# Patient Record
Sex: Male | Born: 1975
Health system: Southern US, Community
[De-identification: ages and names within clinical notes are randomized; demographics above are authoritative.]

## PROBLEM LIST (undated history)

## (undated) DIAGNOSIS — M545 Low back pain, unspecified: Secondary | ICD-10-CM

## (undated) DIAGNOSIS — Z8619 Personal history of other infectious and parasitic diseases: Secondary | ICD-10-CM

## (undated) DIAGNOSIS — F419 Anxiety disorder, unspecified: Secondary | ICD-10-CM

## (undated) DIAGNOSIS — J45909 Unspecified asthma, uncomplicated: Secondary | ICD-10-CM

## (undated) HISTORY — DX: Unspecified asthma, uncomplicated: J45.909

## (undated) HISTORY — DX: Anxiety disorder, unspecified: F41.9

## (undated) HISTORY — PX: TONSILLECTOMY: SUR1361

## (undated) HISTORY — DX: Low back pain: M54.5

## (undated) HISTORY — DX: Low back pain, unspecified: M54.50

## (undated) HISTORY — DX: Personal history of other infectious and parasitic diseases: Z86.19

---

## 2015-08-10 ENCOUNTER — Telehealth: Payer: Self-pay | Admitting: Behavioral Health

## 2015-08-10 NOTE — Telephone Encounter (Signed)
Unable to reach patient at time of Pre-Visit Call.  Left message for patient to return call when available.    

## 2015-08-11 ENCOUNTER — Ambulatory Visit (HOSPITAL_BASED_OUTPATIENT_CLINIC_OR_DEPARTMENT_OTHER)
Admission: RE | Admit: 2015-08-11 | Discharge: 2015-08-11 | Disposition: A | Discharge status: Home / Self Care | Payer: 59 | Source: Ambulatory Visit | Attending: Physician Assistant | Admitting: Physician Assistant

## 2015-08-11 ENCOUNTER — Encounter: Payer: Self-pay | Admitting: Physician Assistant

## 2015-08-11 ENCOUNTER — Ambulatory Visit (INDEPENDENT_AMBULATORY_CARE_PROVIDER_SITE_OTHER): Payer: 59 | Admitting: Physician Assistant

## 2015-08-11 VITALS — BP 122/84 | HR 88 | Temp 98.5°F | Resp 16 | Ht 66.75 in | Wt 141.0 lb

## 2015-08-11 DIAGNOSIS — M545 Low back pain, unspecified: Secondary | ICD-10-CM

## 2015-08-11 DIAGNOSIS — M25551 Pain in right hip: Secondary | ICD-10-CM | POA: Insufficient documentation

## 2015-08-11 DIAGNOSIS — G8929 Other chronic pain: Secondary | ICD-10-CM

## 2015-08-11 MED ORDER — CELECOXIB 200 MG PO CAPS: 200.0000 mg | ORAL_CAPSULE | Freq: Two times a day (BID) | ORAL | 0 refills | Status: DC

## 2015-08-11 NOTE — Patient Instructions (Signed)
Please go to the lab for blood work. Then go downstairs for imaging. I will call with your results.  Start the Celebrex twice daily with food. Limit heavy lifting or overexertion.  Follow-up with me in 2 weeks. Return sooner if needed.

## 2015-08-11 NOTE — Progress Notes (Signed)
Patient presents to clinic today to establish care.  Patient endorses 10 year span of intermittent right-sided low back pain, worsening over the past 6 months. Denies recent trauma or injury. 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 Pain is present daily. Endorses sharp pain with movement, but baseline aching at the end of the day. Notes radiation into R hip and groin/testicle. Denies testicular swelling or abnormality.  Denies history of hernia. Denies change to bowel or bladder habits. Endorses prior negative workup 5 years ago including prior MRI of lumbar spine that he was told was negative.   Health Maintenance: Immunizations -- Is unsure of last tetanus.   Past Medical History:  Diagnosis Date  . History of chicken pox   . LBP (low back pain)     Past Surgical History:  Procedure Laterality Date  . TONSILLECTOMY      No current outpatient prescriptions on file prior to visit.   No current facility-administered medications on file prior to visit.     No Known Allergies  Family History  Problem Relation Age of Onset  . Kidney Stones Mother   . Hepatitis C Father   . Diabetes Father   . Hypertension Father   . Diabetes Maternal Grandmother   . Breast cancer Paternal Grandmother     Metastatic  . Heart disease Paternal Grandfather   . Heart attack Paternal Grandfather     Social History   Social History  . Marital status: Single    Spouse name: N/A  . Number of children: 0  . Years of education: N/A   Occupational History  . 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 Smoking status: Current Every Day Smoker  . Smokeless tobacco: Never Used  . Alcohol use Not on file  . Drug use: Unknown  . Sexual activity: Not on file   Other Topics Concern  . Not on file   Social History Narrative  . No narrative on file   Review of Systems  Constitutional: Positive for malaise/fatigue. Negative for fever and weight  loss.  Cardiovascular: Negative for chest pain.  Gastrointestinal: Negative for abdominal pain, blood in stool, constipation, diarrhea and melena.  Genitourinary: Negative for dysuria, flank pain, frequency, hematuria and urgency.  Musculoskeletal: Positive for back pain and joint pain. Negative for falls, myalgias and neck pain.  Neurological: Negative for dizziness, tingling, tremors and focal weakness.   BP 122/84 (BP Location: Right Arm, Patient Position: Sitting, Cuff Size: Normal)   Pulse 88   Temp 98.5 F (36.9 C) (Oral)   Resp 16   Ht 5' 6.75" (1.695 m)   Wt 141 lb (64 kg)   SpO2 99%   BMI 22.25 kg/m   Physical Exam  Constitutional: He is oriented to person, place, and time and well-developed, well-nourished, and in no distress.  HENT:  Head: Normocephalic and atraumatic.  Eyes: Conjunctivae are normal.  Cardiovascular: Normal rate, regular rhythm, normal heart sounds and intact distal pulses.   Pulmonary/Chest: Effort normal and breath sounds normal. No respiratory distress. He has no wheezes. He has no rales. He exhibits no tenderness.  Musculoskeletal:       Right hip: He exhibits decreased range of motion and tenderness. He exhibits normal strength, no bony tenderness and no swelling.       Lumbar back: He exhibits pain. He exhibits no tenderness, no bony tenderness and no spasm.  Neurological: He is alert  and oriented to person, place, and time.  Skin: Skin is warm and dry. No rash noted.  Psychiatric: Affect normal.  Vitals reviewed.  Assessment/Plan: 1. Chronic lumbosacral pain Arthritis? AS? Will check imaging today and ESR. Will begin Celebrex 200 mg BID. FU 2 weeks. - celecoxib (CELEBREX) 200 MG capsule; Take 1 capsule (200 mg total) by mouth 2 (two) times daily.  Dispense: 60 capsule; Refill: 0 - DG Lumbar Spine Complete; Future - DG HIP UNILAT W OR W/O PELVIS 2-3 VIEWS RIGHT; Future - Sed Rate (ESR)  2. Chronic right hip pain Will obtain x-ray today to  further assess. Rx Celebrex. There is tightness in the abductors and adductors with some mild stiffness on ROM. No pain with ROM. Will obtain ESR today as well. Will attempt to get old records. FU 2 weeks.  - celecoxib (CELEBREX) 200 MG capsule; Take 1 capsule (200 mg total) by mouth 2 (two) times daily.  Dispense: 60 capsule; Refill: 0 - DG Lumbar Spine Complete; Future - DG HIP UNILAT W OR W/O PELVIS 2-3 VIEWS RIGHT; Future - Sed Rate (ESR)   Leeanne Rio, PA-C

## 2015-08-12 LAB — SEDIMENTATION RATE: Sed Rate: 1 mm/hr (ref 0–15)

## 2015-08-17 ENCOUNTER — Telehealth: Payer: Self-pay | Admitting: Physician Assistant

## 2015-08-17 ENCOUNTER — Other Ambulatory Visit: Payer: Self-pay | Admitting: Physician Assistant

## 2015-08-17 DIAGNOSIS — G8929 Other chronic pain: Secondary | ICD-10-CM

## 2015-08-17 DIAGNOSIS — M545 Low back pain, unspecified: Secondary | ICD-10-CM

## 2015-08-17 MED ORDER — CYCLOBENZAPRINE HCL 10 MG PO TABS: 10.0000 mg | ORAL_TABLET | Freq: Every day | ORAL | 0 refills | Status: DC

## 2015-08-17 MED ORDER — TRAMADOL HCL 50 MG PO TABS: 50.0000 mg | ORAL_TABLET | Freq: Two times a day (BID) | ORAL | 1 refills | Status: DC | PRN

## 2015-08-17 NOTE — Telephone Encounter (Signed)
Spoke with patient. Celebrex causing flushing and racing heart. Has stopped medication with no recurrence of symptoms. Will Rx Tramadol to use for moderate to severe pain. Will attempt trial of Flexeril QPM. Referral to Orthopedics placed.

## 2015-08-17 NOTE — Telephone Encounter (Signed)
°  Relation to ZO:XWRUpt:self Call back [phone] Pharmacy:  Reason for call: pt would like for you to call him back, states you had left a message for him on yesterday in regards to him continuing to take rx celebrex pt states he is having a lot of side effects from it and would like to know if there is alternatives states to call him on the work number above he will be there until 4 pm.

## 2015-08-25 ENCOUNTER — Ambulatory Visit: Payer: 59 | Admitting: Physician Assistant

## 2015-09-21 ENCOUNTER — Other Ambulatory Visit: Payer: Self-pay | Admitting: Orthopaedic Surgery

## 2015-09-21 DIAGNOSIS — M545 Low back pain: Secondary | ICD-10-CM

## 2015-10-01 ENCOUNTER — Ambulatory Visit
Admission: RE | Admit: 2015-10-01 | Discharge: 2015-10-01 | Disposition: A | Discharge status: Home / Self Care | Payer: 59 | Source: Ambulatory Visit | Attending: Orthopaedic Surgery | Admitting: Orthopaedic Surgery

## 2015-10-01 DIAGNOSIS — M545 Low back pain: Secondary | ICD-10-CM

## 2015-10-11 ENCOUNTER — Ambulatory Visit (INDEPENDENT_AMBULATORY_CARE_PROVIDER_SITE_OTHER): Payer: 59 | Admitting: Orthopaedic Surgery

## 2015-10-11 DIAGNOSIS — M5440 Lumbago with sciatica, unspecified side: Secondary | ICD-10-CM

## 2016-03-21 ENCOUNTER — Ambulatory Visit (INDEPENDENT_AMBULATORY_CARE_PROVIDER_SITE_OTHER): Payer: 59 | Admitting: Physician Assistant

## 2016-03-21 ENCOUNTER — Encounter: Payer: Self-pay | Admitting: Physician Assistant

## 2016-03-21 DIAGNOSIS — M545 Low back pain, unspecified: Secondary | ICD-10-CM

## 2016-03-21 DIAGNOSIS — M546 Pain in thoracic spine: Secondary | ICD-10-CM | POA: Diagnosis not present

## 2016-03-21 DIAGNOSIS — M542 Cervicalgia: Secondary | ICD-10-CM

## 2016-03-21 MED ORDER — METHOCARBAMOL 500 MG PO TABS: 500.0000 mg | ORAL_TABLET | Freq: Three times a day (TID) | ORAL | 0 refills | Status: DC

## 2016-03-21 MED ORDER — TRAMADOL HCL 50 MG PO TABS: 50.0000 mg | ORAL_TABLET | Freq: Two times a day (BID) | ORAL | 0 refills | Status: DC | PRN

## 2016-03-21 NOTE — Patient Instructions (Signed)
Please limit heavy lifting and overexertion. Take Tramadol and Robaxin as directed if needed for pain and spasm. The Robaxin can be taken each evening, and up to 3 times per day. However you cannot drive or operate heavy machinery while taking this medication.   Please use a heating pad to the affected areas a few times per day. If symptoms are not improving over the next week, please call or come see us.
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
## 2016-03-21 NOTE — Progress Notes (Signed)
Pre visit review using our clinic review tool, if applicable. No additional management support is needed unless otherwise documented below in the visit note. 

## 2016-08-08 ENCOUNTER — Encounter: Payer: Self-pay | Admitting: Physician Assistant

## 2016-08-08 ENCOUNTER — Ambulatory Visit (INDEPENDENT_AMBULATORY_CARE_PROVIDER_SITE_OTHER): Payer: 59 | Admitting: Physician Assistant

## 2016-08-08 VITALS — BP 132/88 | HR 99 | Temp 98.2°F | Resp 14 | Ht 67.0 in | Wt 139.0 lb

## 2016-08-08 DIAGNOSIS — F419 Anxiety disorder, unspecified: Secondary | ICD-10-CM | POA: Diagnosis not present

## 2016-08-08 DIAGNOSIS — F5105 Insomnia due to other mental disorder: Secondary | ICD-10-CM

## 2016-08-08 MED ORDER — VENLAFAXINE HCL ER 37.5 MG PO CP24
ORAL_CAPSULE | ORAL | 0 refills | Status: DC
Start: 2016-08-08 — End: 2016-08-14

## 2016-08-08 MED ORDER — CLONAZEPAM 0.5 MG PO TABS: 0.5000 mg | ORAL_TABLET | Freq: Two times a day (BID) | ORAL | 1 refills | Status: DC | PRN

## 2016-08-08 NOTE — Assessment & Plan Note (Signed)
Long-standing with recent deterioration. Discussed counseling. Handout given. Patient to start using Headspace app. Will start trial of Effexor XR giving history -- 37.5 mg daily x 1 week before increasing to 75 mg daily. Close follow-up scheduled. Alarm signs/symptoms reviewed that would prompt need for ER assessment.

## 2016-08-08 NOTE — Patient Instructions (Signed)
Please go to the lab for blood work. I will call with your results. Start the Effexor as directed.  The Klonopin is used for panic attack only. Not to be taken more than as directed.  Please look into counseling options through your work. The handout I have given you has our counseling services printed on it. I have written down some names for you.  Try to download the Headspace app on your phone to help with anxiety.   Follow-up with me in 3-4 weeks. If symptoms are worsening at all with this regimen, please return to see me immediately.

## 2016-08-08 NOTE — Progress Notes (Signed)
   Patient presents to clinic today to discuss worsening anxiety. Patient endorses prior history of anxiety/panic disorder since early adulthood. Has been dong well for several years off of medication. Notes worsening of symptoms over the past few months, especially with stressful changes at work. Notes several panic attacks per week. Notes some change in sleep. Denies change in appetite. Denies depressed mood or anhedonia. Denies SI/HI. Endorses taking multiple SSRIs, BuSpar and Trazodone previously with no improvement in symptoms. States that Klonopin and similar medications interact badly with him.   Past Medical History:  Diagnosis Date  . History of chicken pox   . LBP (low back pain)     Current Outpatient Prescriptions on File Prior to Visit  Medication Sig Dispense Refill  . ibuprofen (ADVIL,MOTRIN) 200 MG tablet Take 200 mg by mouth every 6 (six) hours as needed.    . Multiple Vitamins-Minerals (MENS MULTIVITAMIN PLUS) TABS Take by mouth daily.     No current facility-administered medications on file prior to visit.     Allergies  Allergen Reactions  . Celebrex [Celecoxib] Palpitations    Family History  Problem Relation Age of Onset  . Kidney Stones Mother   . Hepatitis C Father   . Diabetes Father   . Hypertension Father   . Diabetes Maternal Grandmother   . Breast cancer Paternal Grandmother        Metastatic  . Heart disease Paternal Grandfather   . Heart attack Paternal Grandfather     Social History   Social History  . Marital status: Single    Spouse name: N/A  . Number of children: 0  . Years of education: N/A   Occupational History  . 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 Smoking status: Current Every Day Smoker    Packs/day: 0.25    Years: 25.00    Types: Cigarettes  . Smokeless tobacco: Never Used  . Alcohol use Yes     Comment: social -- maybe 1-2 per week  . Drug use: No  . Sexual activity: Not Currently   Other  Topics Concern  . None   Social History Narrative  . None   Review of Systems - See HPI.  All other ROS are negative.  BP 132/88   Pulse 99   Temp 98.2 F (36.8 C) (Oral)   Resp 14   Ht 5\' 7"  (1.702 m)   Wt 139 lb (63 kg)   SpO2 99%   BMI 21.77 kg/m   Physical Exam  Constitutional: He is oriented to person, place, and time and well-developed, well-nourished, and in no distress.  HENT:  Head: Normocephalic and atraumatic.  Eyes: Conjunctivae are normal.  Neck: Neck supple. No thyromegaly present.  Cardiovascular: Normal rate, regular rhythm, normal heart sounds and intact distal pulses.   Neurological: He is alert and oriented to person, place, and time.  Skin: Skin is warm and dry. No rash noted.  Psychiatric:  Anxious affect.   Vitals reviewed.  Assessment/Plan: Anxiety Long-standing with recent deterioration. Discussed counseling. Handout given. Patient to start using Headspace app. Will start trial of Effexor XR giving history -- 37.5 mg daily x 1 week before increasing to 75 mg daily. Close follow-up scheduled. Alarm signs/symptoms reviewed that would prompt need for ER assessment.     Piedad ClimesMartin, Ivaan Liddy Cody, PA-C

## 2016-08-08 NOTE — Progress Notes (Signed)
Pre visit review using our clinic review tool, if applicable. No additional management support is needed unless otherwise documented below in the visit note. 

## 2016-08-09 ENCOUNTER — Telehealth: Payer: Self-pay | Admitting: Physician Assistant

## 2016-08-09 LAB — TSH: TSH: 1.83 mIU/L (ref 0.40–4.50)

## 2016-08-09 NOTE — Telephone Encounter (Signed)
Patient states that he cannot take Effexor, he is unable to sleep since taking it, he's jittery, his heart is racing, and pupils dilated. Please advise.

## 2016-08-10 NOTE — Telephone Encounter (Signed)
LMOVM advising patient to call back about current symptoms since starting the Effexor.

## 2016-08-10 NOTE — Telephone Encounter (Signed)
Pt to hold anxiety meds.  Since he has been on multiple meds (according to PCP notes) will hold off on starting new med at this time.  He should schedule to see Selena BattenCody early next week

## 2016-08-10 NOTE — Telephone Encounter (Signed)
Advised patient per Dr Beverely Lowabori would like to wait on restarting any medications at this time. Continue to stay off the Effexor. He is agreeable. He is scheduled with PCP on 08/14/16 at 8am.

## 2016-08-10 NOTE — Telephone Encounter (Signed)
Spoke with patient and he states after taking 1 dose of the Effexor he started having the jittery, heart racing symptoms. He has not taken a dose today and symptoms have resolved. Cody did not give rx for Klonopin due to patient stating he has already taken it and it made his anxiety worst.  Please advise in PCP absence

## 2016-08-14 ENCOUNTER — Encounter: Payer: Self-pay | Admitting: Physician Assistant

## 2016-08-14 ENCOUNTER — Ambulatory Visit (INDEPENDENT_AMBULATORY_CARE_PROVIDER_SITE_OTHER): Payer: 59 | Admitting: Physician Assistant

## 2016-08-14 VITALS — BP 128/78 | HR 98 | Temp 98.3°F | Resp 14 | Ht 67.0 in | Wt 141.0 lb

## 2016-08-14 DIAGNOSIS — F419 Anxiety disorder, unspecified: Secondary | ICD-10-CM | POA: Diagnosis not present

## 2016-08-14 MED ORDER — ALPRAZOLAM 0.25 MG PO TABS: 0.2500 mg | ORAL_TABLET | Freq: Two times a day (BID) | ORAL | 0 refills | Status: DC | PRN

## 2016-08-14 MED ORDER — MIRTAZAPINE 15 MG PO TABS: 15.0000 mg | ORAL_TABLET | Freq: Every day | ORAL | 1 refills | Status: DC

## 2016-08-14 NOTE — Patient Instructions (Signed)
Please use the Alprazolam as directed when needed for panic attack. Use sparingly and no more than as directed. Start the Remeron in the evening. This should help with rest and with anxiety.  This is a different class of medication from the Effexor so I am hopeful you will tolerate much better.   Follow-up with me in 2 weeks for a complete physical.

## 2016-08-14 NOTE — Progress Notes (Signed)
Pre visit review using our clinic review tool, if applicable. No additional management support is needed unless otherwise documented below in the visit note. 

## 2016-08-14 NOTE — Progress Notes (Signed)
Patient presents to clinic today to discuss management for anxiety. At visit last week, patient was started on Effexor for anxiety/panic disorder as he is intolerant to most SSRIs, BuSpar, TCA due to side effect. Patient states he took one dose of the medication which made him fell very anxious, jittery and like he was having an out of body experience with medication. Stopped with cessation of medication.   Past Medical History:  Diagnosis Date  . History of chicken pox   . LBP (low back pain)     Current Outpatient Prescriptions on File Prior to Visit  Medication Sig Dispense Refill  . ibuprofen (ADVIL,MOTRIN) 200 MG tablet Take 200 mg by mouth every 6 (six) hours as needed.    . Multiple Vitamins-Minerals (MENS MULTIVITAMIN PLUS) TABS Take by mouth daily.     No current facility-administered medications on file prior to visit.     Allergies  Allergen Reactions  . Celebrex [Celecoxib] Palpitations    Family History  Problem Relation Age of Onset  . Kidney Stones Mother   . Hepatitis C Father   . Diabetes Father   . Hypertension Father   . Diabetes Maternal Grandmother   . Breast cancer Paternal Grandmother        Metastatic  . Heart disease Paternal Grandfather   . Heart attack Paternal Grandfather     Social History   Social History  . Marital status: Single    Spouse name: N/A  . Number of children: 0  . Years of education: N/A   Occupational History  . 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 Smoking status: Current Every Day Smoker    Packs/day: 0.25    Years: 25.00    Types: Cigarettes  . Smokeless tobacco: Never Used  . Alcohol use Yes     Comment: social -- maybe 1-2 per week  . Drug use: No  . Sexual activity: Not Currently   Other Topics Concern  . None   Social History Narrative  . None   Review of Systems - See HPI.  All other ROS are negative.  BP 128/78   Pulse 98   Temp 98.3 F (36.8 C) (Oral)   Resp 14    Ht 5\' 7"  (1.702 m)   Wt 141 lb (64 kg)   SpO2 99%   BMI 22.08 kg/m   Physical Exam  Constitutional: He is oriented to person, place, and time and well-developed, well-nourished, and in no distress.  HENT:  Head: Normocephalic and atraumatic.  Eyes: Conjunctivae are normal.  Neck: Neck supple.  Cardiovascular: Normal rate, regular rhythm, normal heart sounds and intact distal pulses.   Pulmonary/Chest: Effort normal and breath sounds normal. No respiratory distress. He has no wheezes. He has no rales. He exhibits no tenderness.  Neurological: He is alert and oriented to person, place, and time.  Skin: Skin is warm and dry. No rash noted.  Psychiatric: Affect normal.  Vitals reviewed.   Recent Results (from the past 2160 hour(s))  TSH     Status: None   Collection Time: 08/08/16  4:46 PM  Result Value Ref Range   TSH 1.83 0.40 - 4.50 mIU/L    Assessment/Plan: Anxiety Unable to tolerate multiple medications. Notes tolerating Alprazolam previously. Will give short-course of these for panic attack alone. To be used sparingly and no more than as directed. Will start trial or Remeron 15 mg nightly to help with anxiety.  F/U scheduled.     Piedad ClimesMartin, Quron Ruddy Cody, PA-C

## 2016-08-14 NOTE — Assessment & Plan Note (Signed)
Unable to tolerate multiple medications. Notes tolerating Alprazolam previously. Will give short-course of these for panic attack alone. To be used sparingly and no more than as directed. Will start trial or Remeron 15 mg nightly to help with anxiety. F/U scheduled.

## 2016-08-24 ENCOUNTER — Ambulatory Visit: Payer: 59 | Admitting: Psychology

## 2016-09-05 ENCOUNTER — Ambulatory Visit: Payer: Self-pay | Admitting: Physician Assistant

## 2016-09-06 ENCOUNTER — Ambulatory Visit (INDEPENDENT_AMBULATORY_CARE_PROVIDER_SITE_OTHER): Payer: 59 | Admitting: Physician Assistant

## 2016-09-06 ENCOUNTER — Encounter: Payer: Self-pay | Admitting: Physician Assistant

## 2016-09-06 VITALS — BP 112/80 | HR 84 | Temp 98.3°F | Resp 14 | Ht 67.0 in | Wt 142.0 lb

## 2016-09-06 DIAGNOSIS — F419 Anxiety disorder, unspecified: Secondary | ICD-10-CM

## 2016-09-06 DIAGNOSIS — Z0000 Encounter for general adult medical examination without abnormal findings: Secondary | ICD-10-CM | POA: Diagnosis not present

## 2016-09-06 LAB — COMPREHENSIVE METABOLIC PANEL
ALK PHOS: 78 U/L (ref 39–117)
ALT: 25 U/L (ref 0–53)
AST: 19 U/L (ref 0–37)
Albumin: 4.7 g/dL (ref 3.5–5.2)
BUN: 16 mg/dL (ref 6–23)
CALCIUM: 9.5 mg/dL (ref 8.4–10.5)
CO2: 24 mEq/L (ref 19–32)
Chloride: 105 mEq/L (ref 96–112)
Creatinine, Ser: 0.99 mg/dL (ref 0.40–1.50)
GFR: 88.29 mL/min (ref >60.00)
Glucose, Bld: 107 mg/dL — ABNORMAL HIGH (ref 70–99)
Potassium: 4.3 mEq/L (ref 3.5–5.1)
Sodium: 139 mEq/L (ref 135–145)
TOTAL PROTEIN: 6.9 g/dL (ref 6.0–8.3)
Total Bilirubin: 0.7 mg/dL (ref 0.2–1.2)

## 2016-09-06 LAB — LIPID PANEL
Cholesterol: 180 mg/dL (ref 0–200)
HDL: 55.6 mg/dL (ref >39.00)
LDL Cholesterol: 109 mg/dL — ABNORMAL HIGH (ref 0–99)
NONHDL: 124.62
TRIGLYCERIDES: 77 mg/dL (ref 0.0–149.0)
Total CHOL/HDL Ratio: 3
VLDL: 15.4 mg/dL (ref 0.0–40.0)

## 2016-09-06 LAB — HEMOGLOBIN A1C: Hgb A1c MFr Bld: 5 % (ref 4.6–6.5)

## 2016-09-06 LAB — CBC
HCT: 48.3 % (ref 39.0–52.0)
HEMOGLOBIN: 16.8 g/dL (ref 13.0–17.0)
MCHC: 34.8 g/dL (ref 30.0–36.0)
MCV: 96.1 fl (ref 78.0–100.0)
PLATELETS: 272 10*3/uL (ref 150.0–400.0)
RBC: 5.02 Mil/uL (ref 4.22–5.81)
RDW: 12.7 % (ref 11.5–15.5)
WBC: 9.2 10*3/uL (ref 4.0–10.5)

## 2016-09-06 MED ORDER — SUVOREXANT 10 MG PO TABS: 10.0000 mg | ORAL_TABLET | Freq: Every day | ORAL | 0 refills | Status: DC

## 2016-09-06 MED ORDER — ALPRAZOLAM 0.25 MG PO TABS: 0.2500 mg | ORAL_TABLET | Freq: Two times a day (BID) | ORAL | 2 refills | Status: DC | PRN

## 2016-09-06 NOTE — Progress Notes (Signed)
Pre visit review using our clinic review tool, if applicable. No additional management support is needed unless otherwise documented below in the visit note. 

## 2016-09-06 NOTE — Assessment & Plan Note (Signed)
Continue alprazolam PRN for anxiety. Will stop Remeron and start trial of Belsomra nightly for sleep. Sleep hygiene practices reviewed. Increase evening aerobic exercise to promote restful sleep and to help with stress levels. Close follow-up scheduled.

## 2016-09-06 NOTE — Progress Notes (Signed)
Patient presents to clinic today for annual exam.  Patient is fasting for labs. Patient is staying active with work but is otherwise not engaging any physical activity for exercise. Endorses a well-balanced diet overall.   Acute Concerns: Patient denies acute concerns today.  Chronic Issues: Anxiety State -- At last visit patient was started on alprazolam for acute anxiety. Has mainly taken once daily -- rare cases has used twice daily. Is noting improvement in mood. Work stressors are improved which is helping panic attack. Again patient has been on numerous medications for anxiety with side effects/sub therapeutic effects. Does not wish to add on other medications at present.   Insomnia -- Most recently placed on Remeron 15 mg nightly for sleep due to intolerance to other medications. Endorses he took medication as directed for a week but it made him excessively groggy the next day despite getting 8-9 hours of sleep. Was making it hard for him to function in the mornings so he stopped. Would like to discuss potential trial of Ambien or a similar medication.   Health Maintenance: Immunizations -- Declines flu and tetanus today.  Past Medical History:  Diagnosis Date  . History of chicken pox   . LBP (low back pain)     Past Surgical History:  Procedure Laterality Date  . TONSILLECTOMY      Current Outpatient Prescriptions on File Prior to Visit  Medication Sig Dispense Refill  . ibuprofen (ADVIL,MOTRIN) 200 MG tablet Take 200 mg by mouth every 6 (six) hours as needed.    . Multiple Vitamins-Minerals (MENS MULTIVITAMIN PLUS) TABS Take by mouth daily.     No current facility-administered medications on file prior to visit.     Allergies  Allergen Reactions  . Celebrex [Celecoxib] Palpitations    Family History  Problem Relation Age of Onset  . Kidney Stones Mother   . Hepatitis C Father   . Diabetes Father   . Hypertension Father   . Diabetes Maternal Grandmother   .  Breast cancer Paternal Grandmother        Metastatic  . Heart disease Paternal Grandfather   . Heart attack Paternal Grandfather     Social History   Social History  . Marital status: Single    Spouse name: N/A  . Number of children: 0  . Years of education: N/A   Occupational History  . 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 Smoking status: Current Every Day Smoker    Packs/day: 0.25    Years: 25.00    Types: Cigarettes  . Smokeless tobacco: Never Used  . Alcohol use Yes     Comment: social -- maybe 1-2 per week  . Drug use: No  . Sexual activity: Not Currently   Other Topics Concern  . Not on file   Social History Narrative  . No narrative on file   Review of Systems  Constitutional: Negative for fever and weight loss.  HENT: Negative for ear discharge ( ), ear pain, hearing loss and tinnitus.   Eyes: Positive for blurred vision (Gradual blurring over time). Negative for double vision, photophobia and pain.  Respiratory: Negative for cough and shortness of breath.   Cardiovascular: Negative for chest pain and palpitations.  Gastrointestinal: Negative for abdominal pain, blood in stool, constipation, diarrhea, heartburn, melena, nausea and vomiting.  Genitourinary: Negative for dysuria, flank pain, frequency, hematuria and urgency.  Musculoskeletal: Negative for falls.  Neurological: Negative for  dizziness, loss of consciousness and headaches.  Endo/Heme/Allergies: Negative for environmental allergies.  Psychiatric/Behavioral: Negative for depression, hallucinations, substance abuse and suicidal ideas. The patient is not nervous/anxious and does not have insomnia.    BP 112/80   Pulse 84   Temp 98.3 F (36.8 C) (Oral)   Resp 14   Ht 5\' 7"  (1.702 m)   Wt 142 lb (64.4 kg)   SpO2 99%   BMI 22.24 kg/m   Physical Exam  Constitutional: He is oriented to person, place, and time and well-developed, well-nourished, and in no distress.  HENT:   Head: Normocephalic and atraumatic.  Right Ear: External ear normal.  Left Ear: External ear normal.  Nose: Nose normal.  Mouth/Throat: Oropharynx is clear and moist. No oropharyngeal exudate.  Eyes: Pupils are equal, round, and reactive to light. Conjunctivae and EOM are normal.  Neck: Neck supple. No thyromegaly present.  Cardiovascular: Normal rate, regular rhythm, normal heart sounds and intact distal pulses.   Pulmonary/Chest: Effort normal and breath sounds normal. No respiratory distress. He has no wheezes. He has no rales. He exhibits no tenderness.  Abdominal: Soft. Bowel sounds are normal. He exhibits no distension and no mass. There is no tenderness. There is no rebound and no guarding.  Genitourinary: Testes/scrotum normal and penis normal. No discharge found.  Lymphadenopathy:    He has no cervical adenopathy.  Neurological: He is alert and oriented to person, place, and time.  Skin: Skin is warm and dry. No rash noted.  Psychiatric: Affect normal.  Vitals reviewed.  Recent Results (from the past 2160 hour(s))  TSH     Status: None   Collection Time: 08/08/16  4:46 PM  Result Value Ref Range   TSH 1.83 0.40 - 4.50 mIU/L    Assessment/Plan: Insomnia secondary to anxiety Continue alprazolam PRN for anxiety. Will stop Remeron and start trial of Belsomra nightly for sleep. Sleep hygiene practices reviewed. Increase evening aerobic exercise to promote restful sleep and to help with stress levels. Close follow-up scheduled.   Visit for preventive health examination Depression screen negative. Health Maintenance reviewed -- Declines immunizations today. Preventive schedule discussed and handout given in AVS. Will obtain fasting labs today.     Piedad ClimesMartin, Millan Legan Cody, PA-C

## 2016-09-06 NOTE — Assessment & Plan Note (Signed)
Depression screen negative. Health Maintenance reviewed -- Declines immunizations today. Preventive schedule discussed and handout given in AVS. Will obtain fasting labs today.

## 2016-09-06 NOTE — Patient Instructions (Signed)
Please go to the lab for blood work.   Our office will call you with your results unless you have chosen to receive results via MyChart.  If your blood work is normal we will follow-up each year for physicals and as scheduled for chronic medical problems.  If anything is abnormal we will treat accordingly and get you in for a follow-up.  Please stop the Remeron and start the trial of the Belsomra, taking as directed. Continue Alprazolam as directed. Call me next week to let me know how the Belsomra is working.   Preventive Care 18-39 Years, Male Preventive care refers to lifestyle choices and visits with your health care provider that can promote health and wellness. What does preventive care include?  A yearly physical exam. This is also called an annual well check.  Dental exams once or twice a year.  Routine eye exams. Ask your health care provider how often you should have your eyes checked.  Personal lifestyle choices, including: ? Daily care of your teeth and gums. ? Regular physical activity. ? Eating a healthy diet. ? Avoiding tobacco and drug use. ? Limiting alcohol use. ? Practicing safe sex. What happens during an annual well check? The services and screenings done by your health care provider during your annual well check will depend on your age, overall health, lifestyle risk factors, and family history of disease. Counseling Your health care provider may ask you questions about your:  Alcohol use.  Tobacco use.  Drug use.  Emotional well-being.  Home and relationship well-being.  Sexual activity.  Eating habits.  Work and work Statistician.  Screening You may have the following tests or measurements:  Height, weight, and BMI.  Blood pressure.  Lipid and cholesterol levels. These may be checked every 5 years starting at age 58.  Diabetes screening. This is done by checking your blood sugar (glucose) after you have not eaten for a while  (fasting).  Skin check.  Hepatitis C blood test.  Hepatitis B blood test.  Sexually transmitted disease (STD) testing.  Discuss your test results, treatment options, and if necessary, the need for more tests with your health care provider. Vaccines Your health care provider may recommend certain vaccines, such as:  Influenza vaccine. This is recommended every year.  Tetanus, diphtheria, and acellular pertussis (Tdap, Td) vaccine. You may need a Td booster every 10 years.  Varicella vaccine. You may need this if you have not been vaccinated.  HPV vaccine. If you are 3 or younger, you may need three doses over 6 months.  Measles, mumps, and rubella (MMR) vaccine. You may need at least one dose of MMR.You may also need a second dose.  Pneumococcal 13-valent conjugate (PCV13) vaccine. You may need this if you have certain conditions and have not been vaccinated.  Pneumococcal polysaccharide (PPSV23) vaccine. You may need one or two doses if you smoke cigarettes or if you have certain conditions.  Meningococcal vaccine. One dose is recommended if you are age 39-21 years and a first-year college student living in a residence hall, or if you have one of several medical conditions. You may also need additional booster doses.  Hepatitis A vaccine. You may need this if you have certain conditions or if you travel or work in places where you may be exposed to hepatitis A.  Hepatitis B vaccine. You may need this if you have certain conditions or if you travel or work in places where you may be exposed to hepatitis B.  Haemophilus influenzae type b (Hib) vaccine. You may need this if you have certain risk factors.  Talk to your health care provider about which screenings and vaccines you need and how often you need them. This information is not intended to replace advice given to you by your health care provider. Make sure you discuss any questions you have with your health care  provider. Document Released: 02/14/2001 Document Revised: 09/08/2015 Document Reviewed: 10/20/2014 Elsevier Interactive Patient Education  2017 [employer].

## 2016-10-12 ENCOUNTER — Telehealth: Payer: Self-pay | Admitting: Emergency Medicine

## 2016-10-12 NOTE — Telephone Encounter (Signed)
Received a refill of the Mirtazapaine from [facility]. At last ov we changed to Belsomra. He is due for a follow up appointment to discuss.

## 2016-10-16 ENCOUNTER — Encounter: Payer: Self-pay | Admitting: Physician Assistant

## 2016-10-16 ENCOUNTER — Ambulatory Visit (INDEPENDENT_AMBULATORY_CARE_PROVIDER_SITE_OTHER): Payer: 59 | Admitting: Physician Assistant

## 2016-10-16 VITALS — BP 118/80 | HR 87 | Temp 98.1°F | Resp 14 | Ht 67.0 in | Wt 142.0 lb

## 2016-10-16 DIAGNOSIS — F5105 Insomnia due to other mental disorder: Secondary | ICD-10-CM

## 2016-10-16 DIAGNOSIS — F419 Anxiety disorder, unspecified: Secondary | ICD-10-CM | POA: Diagnosis not present

## 2016-10-16 MED ORDER — MELOXICAM 15 MG PO TABS: 15.0000 mg | ORAL_TABLET | Freq: Every day | ORAL | 0 refills | Status: DC

## 2016-10-16 MED ORDER — ZOLPIDEM TARTRATE ER 6.25 MG PO TBCR: 6.2500 mg | EXTENDED_RELEASE_TABLET | Freq: Every evening | ORAL | 0 refills | Status: DC | PRN

## 2016-10-16 NOTE — Progress Notes (Signed)
Pre visit review using our clinic review tool, if applicable. No additional management support is needed unless otherwise documented below in the visit note. 

## 2016-10-16 NOTE — Assessment & Plan Note (Signed)
Will start trial of Ambien CR -- starting at 6.25 mg and titrating if needed. Also discussed sleep hygiene practices. Handout given. Follow-up scheduled

## 2016-10-16 NOTE — Patient Instructions (Signed)
Please start the Ambien as directed. Try to get in some evening exercise to promote restful sleep. Follow the sleep hygiene measures listed at the bottom of this handout.  Attempt the Mobic once daily on days when back pain is flared up. Take with food. If you note any stomach ache or racing heart stop the medication and call me. This can take the place of the Ibuprofen.  Sleep Hygiene  Do: (1) Go to bed at the same time each day. (2) Get up from bed at the same time each day. (3) Get regular exercise each day, preferably in the morning.  There is goof evidence that regular exercise improves restful sleep.  This includes stretching and aerobic exercise. (4) Get regular exposure to outdoor or bright lights, especially in the late afternoon. (5) Keep the temperature in your bedroom comfortable. (6) Keep the bedroom quiet when sleeping. (7) Keep the bedroom dark enough to facilitate sleep. (8) Use your bed only for sleep and sex. (9) Take medications as directed.  It is helpful to take prescribed sleeping pills 1 hour before bedtime, so they are causing drowsiness when you lie down, or 10 hours before getting up, to avoid daytime drowsiness. (10) Use a relaxation exercise just before going to sleep -- imagery, massage, warm bath. (11) Keep your feet and hands warm.  Wear warm socks and/or mittens or gloves to bed.  Don't: (1) Exercise just before going to bed. (2) Engage in stimulating activity just before bed, such as playing a competitive game, watching an exciting program on television, or having an important discussion with a loved one. (3) Have caffeine in the evening (coffee, teas, chocolate, sodas, etc.) (4) Read or watch television in bed. (5) Use alcohol to help you sleep. (6) Go to bed too hungry or too full. (7) Take another person's sleeping pills. (8) Take over-the-counter sleeping pills, without your doctor's knowledge.  Tolerance can develop rapidly with these medications.   Diphenhydramine can have serious side effects for elderly patients. (9) Take daytime naps. (10) Command yourself to go to sleep.  This only makes your mind and body more alert.  If you lie awake for more than 20-30 minutes, get up, go to a different room, participate in a quiet activity (Ex - non-excitable reading or television), and then return to bed when you feel sleepy.  Do this as many times during the night as needed.  This may cause you to have a night or two of poor sleep but it will train your brain to know when it is time for sleep.

## 2016-10-16 NOTE — Progress Notes (Signed)
Patient presents to clinic today for follow-up of insomnia. At last visit, we stopped patient's Remeron and started him on a trial of 10 mg Belsomra to help with sleep. Patient was instructed to call back to let us know how the medication was working but had not. Since last visit, patient endorses taking the trial of Belsomra. Noted averaging about 3 hours of sleep. Was told by insurance the medication would not be covered and he could not afford cost of medication. Was previously on Remeron but too sedating. Trazodone causes palpitations.  Past Medical History:  Diagnosis Date  . History of chicken pox   . LBP (low back pain)     Current Outpatient Prescriptions on File Prior to Visit  Medication Sig Dispense Refill  . ALPRAZolam (XANAX) 0.25 MG tablet Take 1 tablet (0.25 mg total) by mouth 2 (two) times daily as needed for anxiety. 20 tablet 2  . ibuprofen (ADVIL,MOTRIN) 200 MG tablet Take 200 mg by mouth every 6 (six) hours as needed.    . Multiple Vitamins-Minerals (MENS MULTIVITAMIN PLUS) TABS Take by mouth daily.    . Suvorexant (BELSOMRA) 10 MG TABS Take 10 mg by mouth at bedtime. (Patient not taking: Reported on 10/16/2016) 10 tablet 0   No current facility-administered medications on file prior to visit.     Allergies  Allergen Reactions  . Celebrex [Celecoxib] Palpitations    Family History  Problem Relation Age of Onset  . Kidney Stones Mother   . Hepatitis C Father   . Diabetes Father   . Hypertension Father   . Diabetes Maternal Grandmother   . Breast cancer Paternal Grandmother        Metastatic  . Heart disease Paternal Grandfather   . Heart attack Paternal Grandfather     Social History   Social History  . Marital status: Single    Spouse name: N/A  . Number of children: 0  . Years of education: N/A   Occupational History  . 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 Smoking status: Current Every Day Smoker    Packs/day: 0.25   Years: 25.00    Types: Cigarettes  . Smokeless tobacco: Never Used  . Alcohol use Yes     Comment: social -- maybe 1-2 per week  . Drug use: No  . Sexual activity: Not Currently   Other Topics Concern  . None   Social History Narrative  . None   Review of Systems - See HPI.  All other ROS are negative.  BP 118/80   Pulse 87   Temp 98.1 F (36.7 C) (Oral)   Resp 14   Ht  (1.702 m)   Wt 142 lb (64.4 kg)   SpO2 99%   BMI 22.24 kg/m   Physical Exam  Constitutional: He is oriented to person, place, and time and well-developed, well-nourished, and in no distress.  HENT:  Head: Normocephalic and atraumatic.  Neck: Neck supple.  Cardiovascular: Normal rate, regular rhythm, normal heart sounds and intact distal pulses.   Pulmonary/Chest: Effort normal and breath sounds normal. No respiratory distress. He has no wheezes. He has no rales. He exhibits no tenderness.  Neurological: He is alert and oriented to person, place, and time.  Skin: Skin is warm and dry. No rash noted.  Psychiatric: Affect normal.  Vitals reviewed.   Recent Results (from the past 2160 hour(s))  TSH     Status: None  Collection Time: 08/08/16  4:46 PM  Result Value Ref Range   TSH 1.83 0.40 - 4.50 mIU/L  CBC     Status: None   Collection Time: 09/06/16 11:22 AM  Result Value Ref Range   WBC 9.2 4.0 - 10.5 K/uL   RBC 5.02 4.22 - 5.81 Mil/uL   Platelets 272.0 150.0 - 400.0 K/uL   Hemoglobin 16.8 13.0 - 17.0 g/dL   HCT 16.1 09.6 - 04.5 %   MCV 96.1 78.0 - 100.0 fl   MCHC 34.8 30.0 - 36.0 g/dL   RDW 40.9 81.1 - 91.4 %  Comprehensive metabolic panel     Status: Abnormal   Collection Time: 09/06/16 11:22 AM  Result Value Ref Range   Sodium 139 135 - 145 mEq/L   Potassium 4.3 3.5 - 5.1 mEq/L   Chloride 105 96 - 112 mEq/L   CO2 24 19 - 32 mEq/L   Glucose, Bld 107 (H) 70 - 99 mg/dL   BUN 16 6 - 23 mg/dL   Creatinine, Ser 7.82 0.40 - 1.50 mg/dL   Total Bilirubin 0.7 0.2 - 1.2 mg/dL   Alkaline  Phosphatase 78 39 - 117 U/L   AST 19 0 - 37 U/L   ALT 25 0 - 53 U/L   Total Protein 6.9 6.0 - 8.3 g/dL   Albumin 4.7 3.5 - 5.2 g/dL   Calcium 9.5 8.4 - 95.6 mg/dL   GFR 21.30 >86.57 mL/min  Lipid panel     Status: Abnormal   Collection Time: 09/06/16 11:22 AM  Result Value Ref Range   Cholesterol 180 0 - 200 mg/dL    Comment: ATP III Classification       Desirable:  < 200 mg/dL               Borderline High:  200 - 239 mg/dL          High:  > = 846 mg/dL   Triglycerides 96.2 0.0 - 149.0 mg/dL    Comment: Normal:  <952 mg/dLBorderline High:  150 - 199 mg/dL   HDL 84.13 >24.40 mg/dL   VLDL 10.2 0.0 - 72.5 mg/dL   LDL Cholesterol 366 (H) 0 - 99 mg/dL   Total CHOL/HDL Ratio 3     Comment:                Men          Women1/2 Average Risk     3.4          3.3Average Risk          5.0          4.42X Average Risk          9.6          7.13X Average Risk          15.0          11.0                       NonHDL 124.62     Comment: NOTE:  Non-HDL goal should be 30 mg/dL higher than patient's LDL goal (i.e. LDL goal of < 70 mg/dL, would have non-HDL goal of < 100 mg/dL)  Hemoglobin Y4I     Status: None   Collection Time: 09/06/16 11:22 AM  Result Value Ref Range   Hgb A1c MFr Bld 5.0 4.6 - 6.5 %    Comment: Glycemic Control Guidelines for People with Diabetes:Non Diabetic:  <  6%Goal of Therapy: <7%Additional Action Suggested:  >8%     Assessment/Plan: Insomnia secondary to anxiety Will start trial of Ambien CR -- starting at 6.25 mg and titrating if needed. Also discussed sleep hygiene practices. Handout given. Follow-up scheduled    Piedad Climes, PA-C

## 2016-10-23 ENCOUNTER — Telehealth: Payer: Self-pay | Admitting: Physician Assistant

## 2016-10-23 NOTE — Telephone Encounter (Signed)
Pt states that he came in last Monday 10/15 and was told to call back to let Selena BattenCody know how he was doing. Pt states that the Remus Lofflerambien was helping him sleep, but the meloxicam wasn't doing anything for him.

## 2016-10-24 ENCOUNTER — Other Ambulatory Visit: Payer: Self-pay | Admitting: Physician Assistant

## 2016-10-24 NOTE — Telephone Encounter (Signed)
I am glad Vernon Sellers is working for him. In reference to his back, would recommend we consider repeat assessment with Orthopedics.

## 2016-10-24 NOTE — Addendum Note (Signed)
Addended by: Waldon MerlMARTIN, Kaycen Whitworth C on: 10/24/2016 07:41 AM   Modules accepted: Orders

## 2016-10-24 NOTE — Telephone Encounter (Signed)
Patient states that he was seen at two different Orthopedics last year and had an MRI and he was told that there was nothing that could be done.  He states that it cost him a lot of money to see them, and he does not have the money for that right now.

## 2016-10-24 NOTE — Telephone Encounter (Signed)
LMOVM for call back for his back.

## 2016-10-25 MED ORDER — TRAMADOL HCL 50 MG PO TABS: 50.0000 mg | ORAL_TABLET | Freq: Two times a day (BID) | ORAL | 0 refills | Status: DC | PRN

## 2016-10-25 NOTE — Telephone Encounter (Signed)
Since he has not noted improvement with either Celebrex or Meloxicam, we could consider restarting Tramadol on as needed basis. How helpful was the Tramadol when used previously?

## 2016-10-25 NOTE — Telephone Encounter (Signed)
Pt returned your call, please call back at [phone]

## 2016-10-25 NOTE — Telephone Encounter (Signed)
Patient aware of notes below, and medication has been faxed.

## 2016-10-25 NOTE — Telephone Encounter (Signed)
I have printed a trial Rx -- 14 tablets -- to see if this helps. He is to take as directed only when needed for moderate to severe pain.

## 2016-10-25 NOTE — Telephone Encounter (Signed)
Left message for patient to call to discuss.  

## 2016-10-25 NOTE — Addendum Note (Signed)
Addended by: Waldon MerlMARTIN, Scott Fix C on: 10/25/2016 02:04 PM   Modules accepted: Orders

## 2016-10-25 NOTE — Telephone Encounter (Signed)
He states that the tramadol did help previously, and he is willing to try it again.

## 2016-11-03 ENCOUNTER — Telehealth: Payer: Self-pay | Admitting: Physician Assistant

## 2016-11-03 NOTE — Telephone Encounter (Signed)
How often is he taking presently -- If having to take every day we may want to consider an extended release Tramadol.

## 2016-11-03 NOTE — Telephone Encounter (Signed)
Routed to PCP 

## 2016-11-03 NOTE — Telephone Encounter (Signed)
Left message for patient to call to discuss how he is taking medication.

## 2016-11-03 NOTE — Telephone Encounter (Signed)
Patient states that he is taking it every single day at this point, most every other day.  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 He said that usually closer to the holidays he will be standing quite a lot so he feels like he might would have to take it more often.  But for right now he has not taken it twice a day.

## 2016-11-03 NOTE — Telephone Encounter (Signed)
FYI, Pt states that the Rx tramadol is working much better for him and want to continue taking this.

## 2016-11-05 ENCOUNTER — Other Ambulatory Visit: Payer: Self-pay | Admitting: Physician Assistant

## 2016-11-05 DIAGNOSIS — F5105 Insomnia due to other mental disorder: Principal | ICD-10-CM

## 2016-11-05 DIAGNOSIS — F419 Anxiety disorder, unspecified: Secondary | ICD-10-CM

## 2016-11-06 NOTE — Telephone Encounter (Signed)
Ok to phone in a full month's refill -- 30 tablets with 0 refills. Follow-up in 4 weeks.

## 2016-11-06 NOTE — Telephone Encounter (Signed)
Called medication in to pharmacy with the notes below.  I also left patient a VM with details and advised that he call to schedule a follow-up in 4 weeks.

## 2016-11-13 ENCOUNTER — Telehealth: Payer: Self-pay | Admitting: Physician Assistant

## 2016-11-13 NOTE — Telephone Encounter (Signed)
Pt called. States that some how medications (alprazolam & meloxicam) where put on auto-refill, generating requests to provider. No longer taking the medications, asks to disregard the requests and can he be taken off the auto-refill.

## 2016-11-15 NOTE — Addendum Note (Signed)
Addended by: Con MemosMOORE, Andrzej Scully S on: 11/15/2016 01:40 PM   Modules accepted: Orders

## 2016-12-04 ENCOUNTER — Ambulatory Visit (INDEPENDENT_AMBULATORY_CARE_PROVIDER_SITE_OTHER): Payer: 59 | Admitting: Physician Assistant

## 2016-12-04 ENCOUNTER — Encounter: Payer: Self-pay | Admitting: Physician Assistant

## 2016-12-04 VITALS — BP 126/80 | HR 102 | Temp 98.2°F | Resp 14 | Ht 67.0 in | Wt 142.0 lb

## 2016-12-04 DIAGNOSIS — L219 Seborrheic dermatitis, unspecified: Secondary | ICD-10-CM | POA: Insufficient documentation

## 2016-12-04 DIAGNOSIS — F5105 Insomnia due to other mental disorder: Secondary | ICD-10-CM | POA: Diagnosis not present

## 2016-12-04 DIAGNOSIS — M549 Dorsalgia, unspecified: Secondary | ICD-10-CM | POA: Insufficient documentation

## 2016-12-04 DIAGNOSIS — F419 Anxiety disorder, unspecified: Secondary | ICD-10-CM

## 2016-12-04 DIAGNOSIS — J019 Acute sinusitis, unspecified: Secondary | ICD-10-CM

## 2016-12-04 DIAGNOSIS — B9689 Other specified bacterial agents as the cause of diseases classified elsewhere: Secondary | ICD-10-CM

## 2016-12-04 MED ORDER — ALPRAZOLAM 0.25 MG PO TABS: 0.2500 mg | ORAL_TABLET | Freq: Two times a day (BID) | ORAL | 1 refills | Status: DC | PRN

## 2016-12-04 MED ORDER — DOXYCYCLINE HYCLATE 100 MG PO CAPS: 100.0000 mg | ORAL_CAPSULE | Freq: Two times a day (BID) | ORAL | 0 refills | Status: DC

## 2016-12-04 MED ORDER — TRAMADOL HCL 50 MG PO TABS: 50.0000 mg | ORAL_TABLET | Freq: Two times a day (BID) | ORAL | 0 refills | Status: DC | PRN

## 2016-12-04 MED ORDER — ZOLPIDEM TARTRATE ER 6.25 MG PO TBCR: 6.2500 mg | EXTENDED_RELEASE_TABLET | Freq: Every evening | ORAL | 2 refills | Status: DC | PRN

## 2016-12-04 MED ORDER — KETOCONAZOLE 2 % EX SHAM: 1.0000 "application " | MEDICATED_SHAMPOO | CUTANEOUS | 0 refills | Status: DC

## 2016-12-04 NOTE — Patient Instructions (Signed)
Please continue chronic medications as directed. I am glad you are doing so well.  For the scalp, apply the prescription shampoo twice weekly for 2-4 weeks. Rinse well. Let me know if this is not resolving symptoms.  Please take antibiotic as directed.  Increase fluid intake.  Use Saline nasal spray.  Take a daily multivitamin.  Place a humidifier in the bedroom.  Please call or return clinic if symptoms are not improving.  Sinusitis Sinusitis is redness, soreness, and swelling (inflammation) of the paranasal sinuses. Paranasal sinuses are air pockets within the bones of your face (beneath the eyes, the middle of the forehead, or above the eyes). In healthy paranasal sinuses, mucus is able to drain out, and air is able to circulate through them by way of your nose. However, when your paranasal sinuses are inflamed, mucus and air can become trapped. This can allow bacteria and other germs to grow and cause infection. Sinusitis can develop quickly and last only a short time (acute) or continue over a long period (chronic). Sinusitis that lasts for more than 12 weeks is considered chronic.  CAUSES  Causes of sinusitis include:  Allergies.  Structural abnormalities, such as displacement of the cartilage that separates your nostrils (deviated septum), which can decrease the air flow through your nose and sinuses and affect sinus drainage.  Functional abnormalities, such as when the small hairs (cilia) that line your sinuses and help remove mucus do not work properly or are not present. SYMPTOMS  Symptoms of acute and chronic sinusitis are the same. The primary symptoms are pain and pressure around the affected sinuses. Other symptoms include:  Upper toothache.  Earache.  Headache.  Bad breath.  Decreased sense of smell and taste.  A cough, which worsens when you are lying flat.  Fatigue.  Fever.  Thick drainage from your nose, which often is green and may contain pus  (purulent).  Swelling and warmth over the affected sinuses. DIAGNOSIS  Your caregiver will perform a physical exam. During the exam, your caregiver may:  Look in your nose for signs of abnormal growths in your nostrils (nasal polyps).  Tap over the affected sinus to check for signs of infection.  View the inside of your sinuses (endoscopy) with a special imaging device with a light attached (endoscope), which is inserted into your sinuses. If your caregiver suspects that you have chronic sinusitis, one or more of the following tests may be recommended:  Allergy tests.  Nasal culture A sample of mucus is taken from your nose and sent to a lab and screened for bacteria.  Nasal cytology A sample of mucus is taken from your nose and examined by your caregiver to determine if your sinusitis is related to an allergy. TREATMENT  Most cases of acute sinusitis are related to a viral infection and will resolve on their own within 10 days. Sometimes medicines are prescribed to help relieve symptoms (pain medicine, decongestants, nasal steroid sprays, or saline sprays).  However, for sinusitis related to a bacterial infection, your caregiver will prescribe antibiotic medicines. These are medicines that will help kill the bacteria causing the infection.  Rarely, sinusitis is caused by a fungal infection. In theses cases, your caregiver will prescribe antifungal medicine. For some cases of chronic sinusitis, surgery is needed. Generally, these are cases in which sinusitis recurs more than 3 times per year, despite other treatments. HOME CARE INSTRUCTIONS   Drink plenty of water. Water helps thin the mucus so your sinuses can drain more  easily.  Use a humidifier.  Inhale steam 3 to 4 times a day (for example, sit in the bathroom with the shower running).  Apply a warm, moist washcloth to your face 3 to 4 times a day, or as directed by your caregiver.  Use saline nasal sprays to help moisten and  clean your sinuses.  Take over-the-counter or prescription medicines for pain, discomfort, or fever only as directed by your caregiver. SEEK IMMEDIATE MEDICAL CARE IF:  You have increasing pain or severe headaches.  You have nausea, vomiting, or drowsiness.  You have swelling around your face.  You have vision problems.  You have a stiff neck.  You have difficulty breathing. MAKE SURE YOU:   Understand these instructions.  Will watch your condition.  Will get help right away if you are not doing well or get worse. Document Released: 12/19/2004 Document Revised: 03/13/2011 Document Reviewed: 01/03/2011 [facility] Patient Information 2014 [place], [place].

## 2016-12-04 NOTE — Progress Notes (Signed)
Pre visit review using our clinic review tool, if applicable. No additional management support is needed unless otherwise documented below in the visit note. 

## 2016-12-04 NOTE — Progress Notes (Signed)
Patient presents to clinic today for follow-up. Patient also with acute concerns at today's visit.   Insomnia -- Currently on CR Ambien 6.25 mg daily. Is taking as directed and ding very well. Sleeping 7 hours per night. No daytime somnolence or parasomnias.  Patient endorses 2 weeks of sinus pressure with nasal congestion, thick nasal drainage and sinus pain. Denies fever, chills. Notes some mild cough. Has taken Nyquil and Dayquil for symptoms relief.  Patient also notes itchy patches of scalp with significant scaling. Has tried OTC dandruff shampoos without improvement. Denies any hair loss. Denies similar areas elsewhere.  Past Medical History:  Diagnosis Date  . History of chicken pox   . LBP (low back pain)     Current Outpatient Medications on File Prior to Visit  Medication Sig Dispense Refill  . ALPRAZolam (XANAX) 0.25 MG tablet Take 1 tablet (0.25 mg total) by mouth 2 (two) times daily as needed for anxiety. 20 tablet 2  . ibuprofen (ADVIL,MOTRIN) 200 MG tablet Take 200 mg by mouth every 6 (six) hours as needed.    . Multiple Vitamins-Minerals (MENS MULTIVITAMIN PLUS) TABS Take by mouth daily.    . traMADol (ULTRAM) 50 MG tablet Take 1 tablet (50 mg total) by mouth every 12 (twelve) hours as needed. 14 tablet 0  . zolpidem (AMBIEN CR) 6.25 MG CR tablet Take 1 tablet (6.25 mg total) by mouth at bedtime as needed for sleep. 30 tablet 0   No current facility-administered medications on file prior to visit.     Allergies  Allergen Reactions  . Celebrex [Celecoxib] Palpitations    Family History  Problem Relation Age of Onset  . Kidney Stones Mother   . Hepatitis C Father   . Diabetes Father   . Hypertension Father   . Diabetes Maternal Grandmother   . Breast cancer Paternal Grandmother        Metastatic  . Heart disease Paternal Grandfather   . Heart attack Paternal Grandfather     Social History   Socioeconomic History  . Marital status: Single    Spouse  name: None  . Number of children: 0  . Years of education: None  . Highest education level: None  Social Needs  . Financial resource strain: None  . Food insecurity - worry: None  . Food insecurity - inability: None  . Transportation needs - medical: None  . Transportation needs - non-medical: None  Occupational History  . Occupation: [employer]     Comment: Consulting civil engineerhotolab supervisor  Tobacco Use  . Smoking status: Current Every Day Smoker    Packs/day: 0.25    Years: 25.00    Pack years: 6.25    Types: Cigarettes  . Smokeless tobacco: Never Used  Substance and Sexual Activity  . Alcohol use: Yes    Comment: social -- maybe 1-2 per week  . Drug use: No  . Sexual activity: Not Currently  Other Topics Concern  . None  Social History Narrative  . None   Review of Systems - See HPI.  All other ROS are negative.  BP 126/80   Pulse (!) 102   Temp 98.2 F (36.8 C) (Oral)   Resp 14   Ht 5\' 7"  (1.702 m)   Wt 142 lb (64.4 kg)   SpO2 98%   BMI 22.24 kg/m   Physical Exam  Constitutional: He is oriented to person, place, and time and well-developed, well-nourished, and in no distress.  HENT:  Head: Normocephalic and atraumatic.  Right Ear: Tympanic membrane normal.  Left Ear: Tympanic membrane normal.  Nose: Mucosal edema and rhinorrhea present. Right sinus exhibits maxillary sinus tenderness. Left sinus exhibits maxillary sinus tenderness.  Mouth/Throat: Uvula is midline, oropharynx is clear and moist and mucous membranes are normal.  Eyes: Conjunctivae are normal.  Neck: Neck supple.  Cardiovascular: Normal rate, regular rhythm, normal heart sounds and intact distal pulses.  Pulmonary/Chest: Effort normal and breath sounds normal. No respiratory distress. He has no wheezes. He has no rales. He exhibits no tenderness.  Neurological: He is alert and oriented to person, place, and time.  Skin: Skin is warm and dry. No rash noted.     Psychiatric: Affect normal.  Vitals  reviewed.  Recent Results (from the past 2160 hour(s))  CBC     Status: None   Collection Time: 09/06/16 11:22 AM  Result Value Ref Range   WBC 9.2 4.0 - 10.5 K/uL   RBC 5.02 4.22 - 5.81 Mil/uL   Platelets 272.0 150.0 - 400.0 K/uL   Hemoglobin 16.8 13.0 - 17.0 g/dL   HCT 16.148.3 09.639.0 - 04.552.0 %   MCV 96.1 78.0 - 100.0 fl   MCHC 34.8 30.0 - 36.0 g/dL   RDW 40.912.7 81.111.5 - 91.415.5 %  Comprehensive metabolic panel     Status: Abnormal   Collection Time: 09/06/16 11:22 AM  Result Value Ref Range   Sodium 139 135 - 145 mEq/L   Potassium 4.3 3.5 - 5.1 mEq/L   Chloride 105 96 - 112 mEq/L   CO2 24 19 - 32 mEq/L   Glucose, Bld 107 (H) 70 - 99 mg/dL   BUN 16 6 - 23 mg/dL   Creatinine, Ser 7.820.99 0.40 - 1.50 mg/dL   Total Bilirubin 0.7 0.2 - 1.2 mg/dL   Alkaline Phosphatase 78 39 - 117 U/L   AST 19 0 - 37 U/L   ALT 25 0 - 53 U/L   Total Protein 6.9 6.0 - 8.3 g/dL   Albumin 4.7 3.5 - 5.2 g/dL   Calcium 9.5 8.4 - 95.610.5 mg/dL   GFR 21.3088.29 >86.57>60.00 mL/min  Lipid panel     Status: Abnormal   Collection Time: 09/06/16 11:22 AM  Result Value Ref Range   Cholesterol 180 0 - 200 mg/dL    Comment: ATP III Classification       Desirable:  < 200 mg/dL               Borderline High:  200 - 239 mg/dL          High:  > = 846240 mg/dL   Triglycerides 96.277.0 0.0 - 149.0 mg/dL    Comment: Normal:  <952<150 mg/dLBorderline High:  150 - 199 mg/dL   HDL 84.1355.60 >24.40>39.00 mg/dL   VLDL 10.215.4 0.0 - 72.540.0 mg/dL   LDL Cholesterol 366109 (H) 0 - 99 mg/dL   Total CHOL/HDL Ratio 3     Comment:                Men          Women1/2 Average Risk     3.4          3.3Average Risk          5.0          4.42X Average Risk          9.6          7.13X Average Risk          15.0  11.0                       NonHDL 124.62     Comment: NOTE:  Non-HDL goal should be 30 mg/dL higher than patient's LDL goal (i.e. LDL goal of < 70 mg/dL, would have non-HDL goal of < 100 mg/dL)  Hemoglobin Z6X     Status: None   Collection Time: 09/06/16 11:22 AM  Result  Value Ref Range   Hgb A1c MFr Bld 5.0 4.6 - 6.5 %    Comment: Glycemic Control Guidelines for People with Diabetes:Non Diabetic:  <6%Goal of Therapy: <7%Additional Action Suggested:  >8%    Assessment/Plan: 1. Insomnia secondary to anxiety Insomnia secondary to anxiety Much improved with low-dose CR Ambien. Have tried multiple other medications previously with side effects or that were subtherapeutic. Will continue current regimen.  - zolpidem (AMBIEN CR) 6.25 MG CR tablet; Take 1 tablet (6.25 mg total) by mouth at bedtime as needed for sleep.  Dispense: 30 tablet; Refill: 2  2. Acute bacterial sinusitis Rx Doxycycline.  Increase fluids.  Rest.  Saline nasal spray.  Probiotic.  Mucinex as directed.  Humidifier in bedroom.  Call or return to clinic if symptoms are not improving.  - doxycycline (VIBRAMYCIN) 100 MG capsule; Take 1 capsule (100 mg total) by mouth 2 (two) times daily.  Dispense: 20 capsule; Refill: 0  3. Subacute back pain Tramadol PRN. Supportive measures and OTC medications reviewed.  4. Seborrheic dermatitis of scalp Not responding to OTC treatments. Start Ketoconazole shampoo twice weekly x 2-4 weeks. Derm if not improving.   Piedad Climes, PA-C

## 2016-12-04 NOTE — Assessment & Plan Note (Signed)
Much improved with low-dose CR Ambien. Have tried multiple other medications previously with side effects or that were subtherapeutic. Will continue current regimen.

## 2017-01-17 ENCOUNTER — Telehealth: Payer: Self-pay | Admitting: Physician Assistant

## 2017-01-17 MED ORDER — ALPRAZOLAM 0.25 MG PO TABS: 0.2500 mg | ORAL_TABLET | Freq: Two times a day (BID) | ORAL | 1 refills | Status: DC | PRN

## 2017-01-17 NOTE — Telephone Encounter (Signed)
Routed to provider LR: 12/04/16 #20 1 RF

## 2017-01-17 NOTE — Telephone Encounter (Signed)
Patient has been informed. Stated verbal understanding

## 2017-01-17 NOTE — Telephone Encounter (Signed)
Copied from CRM [id number]. Topic: Quick Communication - Rx Refill/Question >> Jan 17, 2017  1:29 PM Landry MellowFoltz, Melissa J wrote: Medication: alprazolam    Has the patient contacted their pharmacy? No. Pick up    (Agent: If no, request that the patient contact the pharmacy for the refill.)   Preferred Pharmacy (with phone number [facility] street [facility] [place], pt has picked up rx in past. Call [phone]    Agent: Please be advised that RX refills may take up to 3 business days. We ask that you follow-up with your pharmacy.

## 2017-01-17 NOTE — Telephone Encounter (Signed)
Refill granted. Can now send electronically so no pickup needed.

## 2017-03-05 ENCOUNTER — Other Ambulatory Visit: Payer: Self-pay | Admitting: Emergency Medicine

## 2017-03-05 MED ORDER — ALPRAZOLAM 0.25 MG PO TABS: 0.2500 mg | ORAL_TABLET | Freq: Two times a day (BID) | ORAL | 0 refills | Status: DC | PRN

## 2017-03-05 NOTE — Telephone Encounter (Signed)
Xanax last refilled 01/17/17 #20 1RF No CSC, UDS Last OV: 12/04/16  Please advise   Copied from CRM [phone]. Topic: Quick Communication - Rx Refill/Question >> Mar 05, 2017  2:02 PM Elliot GaultBell, Tiffany M wrote: Relation to pt: self Call back number: [phone] Pharmacy: [id number] [place], [place] - [address] [phone] (Phone) [phone] (Fax)  Reason for call:   Patient requesting ALPRAZolam Prudy Feeler(XANAX) 0.25 MG tablet, patient denied contacting the pharmacy and will in the future, patient aware to allow 48 to 72 hour turn around, please advise

## 2017-03-06 ENCOUNTER — Ambulatory Visit: Payer: 59 | Admitting: *Deleted

## 2017-03-06 ENCOUNTER — Encounter: Payer: Self-pay | Admitting: Emergency Medicine

## 2017-03-06 DIAGNOSIS — Z9229 Personal history of other drug therapy: Secondary | ICD-10-CM

## 2017-03-06 NOTE — Telephone Encounter (Signed)
LMOVM advising patient rx is ready for pick up at the front desk. Patient needs a new CSC and give a UDS

## 2017-03-20 LAB — PAIN MGMT, PROFILE 8 W/CONF, U
6 Acetylmorphine: NEGATIVE ng/mL (ref <10)
Amphetamines: NEGATIVE ng/mL (ref <500)
BUPRENORPHINE, URINE: NEGATIVE ng/mL (ref <5)
Cocaine Metabolite: NEGATIVE ng/mL (ref <150)
Codeine: NEGATIVE ng/mL (ref <50)
Creatinine: 155.7 mg/dL
HYDROCODONE: 278 ng/mL — AB (ref <50)
Hydromorphone: 57 ng/mL — ABNORMAL HIGH (ref <50)
MDMA: NEGATIVE ng/mL (ref <500)
MORPHINE: NEGATIVE ng/mL (ref <50)
NORHYDROCODONE: 293 ng/mL — AB (ref <50)
Opiates: POSITIVE ng/mL — AB (ref <100)
Oxidant: NEGATIVE ug/mL (ref <200)
Oxycodone: NEGATIVE ng/mL (ref <100)
pH: 5.02 (ref 4.5–9.0)

## 2017-03-21 ENCOUNTER — Telehealth: Payer: Self-pay

## 2017-03-21 NOTE — Telephone Encounter (Signed)
Copied from CRM [phone]. Topic: General - Call Back - No Documentation >> Mar 20, 2017  3:45 PM Landry MellowFoltz, Melissa J wrote: Reason for CRM: [phone] Pt is calling back to [facility], says it is about labs. No crm    Called patent and left a detailed message for him to call back. I need to speak to him to go over message from [place] about lab results.

## 2017-03-27 ENCOUNTER — Other Ambulatory Visit: Payer: Self-pay | Admitting: Physician Assistant

## 2017-03-27 NOTE — Telephone Encounter (Signed)
See pended rx

## 2017-03-28 NOTE — Telephone Encounter (Signed)
Will grant fill. Recently with hydrocodone in last UDS. Signed CSC after giving that sample. Reviewed contract with patient. Will repeat UDS at next fill.

## 2017-04-18 ENCOUNTER — Other Ambulatory Visit: Payer: Self-pay | Admitting: Physician Assistant

## 2017-04-18 NOTE — Telephone Encounter (Signed)
Last OV 12/04/16 ( Insomnia secondary to anxiety), No future OV  Last filled 03/28/17, #20 w/0 refills

## 2017-04-27 ENCOUNTER — Telehealth: Payer: Self-pay | Admitting: Physician Assistant

## 2017-04-27 NOTE — Telephone Encounter (Signed)
I called pt and LM that we send all UDS to [facility]. Provider is not privy to know what insurance will or will not cover and I am unable to see any [facility] billing. In the future if he has another [facility]DS [facility].   Amanda     Copied from CRM [id number]. Topic: Complaint - Billing/Coding >> Apr 26, 2017  4:11 PM Lorrine KinMcGee, Demi B, [place] wrote: Patient name/MRN/Acct #: Vernon Sellers [id number] Invoice # on bill: [id number] DOS: 03/06/2017 Details of complaint: Patient does not feel he is obligated to pay this [facility] bill due to the fact he was seen in house and did labs there. How would the patient like to see it resolved?  On a scale of 1-10, how was your experience?  What would it take to bring it to a 10?   Patient states that he was told that to receive this medication he would have to do a drug test. Was under the impression that he would not have to pay for this since it is required to continue to receive a prescription for the medication ALPRAZolam (XANAX) 0.25 MG tablet.  >> Apr 26, 2017  4:27 PM Brodmerkel, Joyce CopaJessica L, CMA wrote: Please advise, I am not sure how to proceed since the billing has changed.  >> Apr 27, 2017  7:36 AM Samule DryBarnes, Amy H wrote: Centralized coding is unable to handle lab billing issues.. Office will handle.   Thank you

## 2017-05-08 ENCOUNTER — Other Ambulatory Visit: Payer: Self-pay | Admitting: Physician Assistant

## 2017-05-08 NOTE — Telephone Encounter (Signed)
Xanax last filled on 04/19/17 #20 0RF CSC: 03/16/17 UDS: 03/06/17 LOV: 12/04/16  Please advise

## 2017-05-24 ENCOUNTER — Telehealth: Payer: Self-pay | Admitting: Physician Assistant

## 2017-05-24 DIAGNOSIS — F5105 Insomnia due to other mental disorder: Principal | ICD-10-CM

## 2017-05-24 DIAGNOSIS — F419 Anxiety disorder, unspecified: Secondary | ICD-10-CM

## 2017-05-24 NOTE — Telephone Encounter (Signed)
Refill request for Ambien, last filled on 12/04/16 #30   LOV: 12/04/16 [facility]ellers  [facility] [place]

## 2017-05-24 NOTE — Telephone Encounter (Signed)
Copied from CRM [id number]. Topic: Quick Communication - Rx Refill/Question >> May 24, 2017  1:49 PM Laural Benes, [place] C wrote: Medication: zolpidem (AMBIEN CR) 6.25 MG CR tablet- 1 month per pharmacy it is cost affective.  Has the patient contacted their pharmacy?  Yes  (Agent: If no, request that the patient contact the pharmacy for the refill.) (Agent: If yes, when and what did the pharmacy advise?)  Preferred Pharmacy (with phone number or street name): [id number] [place], [place] - [address] [phone] (Phone) [phone] (Fax)      Agent: Please be advised that RX refills may take up to 3 business days. We ask that you follow-up with your pharmacy.

## 2017-05-25 MED ORDER — ZOLPIDEM TARTRATE ER 6.25 MG PO TBCR: 6.2500 mg | EXTENDED_RELEASE_TABLET | Freq: Every evening | ORAL | 0 refills | Status: DC | PRN

## 2017-05-25 NOTE — Telephone Encounter (Signed)
Will allow one refill.  He is due for follow-up of chronic issues in June-July.

## 2017-05-29 ENCOUNTER — Other Ambulatory Visit: Payer: Self-pay | Admitting: Physician Assistant

## 2017-05-30 NOTE — Telephone Encounter (Signed)
Last OV 12/04/2016, No future OV  Last filled 05/09/2017, # 20 with 0 refills

## 2017-06-19 ENCOUNTER — Other Ambulatory Visit: Payer: Self-pay | Admitting: Physician Assistant

## 2017-06-20 ENCOUNTER — Other Ambulatory Visit: Payer: Self-pay | Admitting: Physician Assistant

## 2017-06-21 ENCOUNTER — Other Ambulatory Visit: Payer: Self-pay | Admitting: Physician Assistant

## 2017-06-21 NOTE — Telephone Encounter (Signed)
LMOM advising patient he is due for an follow up appointment for Anxiety. Has to have appointments  Every 3-6 months.

## 2017-06-21 NOTE — Telephone Encounter (Signed)
Last OV 12/04/16, No future OV  Last filled 05/31/17, #20 with 0 refills  Note needs future OV for further refills

## 2017-06-21 NOTE — Telephone Encounter (Signed)
Last OV 12/04/16, No future OV  Last filled 05/31/2017, # 20 with 0 refills

## 2017-06-22 ENCOUNTER — Other Ambulatory Visit: Payer: Self-pay | Admitting: Physician Assistant

## 2017-06-22 NOTE — Telephone Encounter (Signed)
Last OV 12/04/16, Next OV 07/02/17  Last filled 05/31/17, #20 with 0 refills

## 2017-07-02 ENCOUNTER — Other Ambulatory Visit: Payer: Self-pay

## 2017-07-02 ENCOUNTER — Ambulatory Visit (INDEPENDENT_AMBULATORY_CARE_PROVIDER_SITE_OTHER): Payer: 59 | Admitting: Physician Assistant

## 2017-07-02 ENCOUNTER — Encounter: Payer: Self-pay | Admitting: Physician Assistant

## 2017-07-02 VITALS — BP 118/80 | HR 101 | Temp 98.2°F | Resp 16 | Ht 67.0 in | Wt 142.8 lb

## 2017-07-02 DIAGNOSIS — M5431 Sciatica, right side: Secondary | ICD-10-CM | POA: Diagnosis not present

## 2017-07-02 DIAGNOSIS — F419 Anxiety disorder, unspecified: Secondary | ICD-10-CM | POA: Diagnosis not present

## 2017-07-02 DIAGNOSIS — F5105 Insomnia due to other mental disorder: Secondary | ICD-10-CM | POA: Diagnosis not present

## 2017-07-02 MED ORDER — ZOLPIDEM TARTRATE ER 12.5 MG PO TBCR: 12.5000 mg | EXTENDED_RELEASE_TABLET | Freq: Every evening | ORAL | 0 refills | Status: DC | PRN

## 2017-07-02 MED ORDER — ALPRAZOLAM 0.25 MG PO TABS: 0.2500 mg | ORAL_TABLET | Freq: Two times a day (BID) | ORAL | 0 refills | Status: DC | PRN

## 2017-07-02 MED ORDER — METHYLPREDNISOLONE 4 MG PO TBPK: ORAL_TABLET | ORAL | 0 refills | Status: DC

## 2017-07-02 NOTE — Patient Instructions (Signed)
Please continue medications as directed, taking the new dose of the Ambien CR.   For the back, limit heavy lifting and overexertion.  Continue wearing the supportive footwear daily.  Take the steroid pack as directed. Start the stretches below.  If not improving we will need to get you back in with Dr. Rayburn Ma.    Sciatica Rehab Ask your health care provider which exercises are safe for you. Do exercises exactly as told by your health care provider and adjust them as directed. It is normal to feel mild stretching, pulling, tightness, or discomfort as you do these exercises, but you should stop right away if you feel sudden pain or your pain gets worse.Do not begin these exercises until told by your health care provider. Stretching and range of motion exercises These exercises warm up your muscles and joints and improve the movement and flexibility of your hips and your back. These exercises also help to relieve pain, numbness, and tingling. Exercise A: Sciatic nerve glide 1. Sit in a chair with your head facing down toward your chest. Place your hands behind your back. Let your shoulders slump forward. 2. Slowly straighten one of your knees while you tilt your head back as if you are looking toward the ceiling. Only straighten your leg as far as you can without making your symptoms worse. 3. Hold for __________ seconds. 4. Slowly return to the starting position. 5. Repeat with your other leg. Repeat __________ times. Complete this exercise __________ times a day. Exercise B: Knee to chest with hip adduction and internal rotation  1. Lie on your back on a firm surface with both legs straight. 2. Bend one of your knees and move it up toward your chest until you feel a gentle stretch in your lower back and buttock. Then, move your knee toward the shoulder that is on the opposite side from your leg. ? Hold your leg in this position by holding onto the front of your knee. 3. Hold for  __________ seconds. 4. Slowly return to the starting position. 5. Repeat with your other leg. Repeat __________ times. Complete this exercise __________ times a day. Exercise C: Prone extension on elbows  1. Lie on your abdomen on a firm surface. A bed may be too soft for this exercise. 2. Prop yourself up on your elbows. 3. Use your arms to help lift your chest up until you feel a gentle stretch in your abdomen and your lower back. ? This will place some of your body weight on your elbows. If this is uncomfortable, try stacking pillows under your chest. ? Your hips should stay down, against the surface that you are lying on. Keep your hip and back muscles relaxed. 4. Hold for __________ seconds. 5. Slowly relax your upper body and return to the starting position. Repeat __________ times. Complete this exercise __________ times a day. Strengthening exercises These exercises build strength and endurance in your back. Endurance is the ability to use your muscles for a long time, even after they get tired. Exercise D: Pelvic tilt 1. Lie on your back on a firm surface. Bend your knees and keep your feet flat. 2. Tense your abdominal muscles. Tip your pelvis up toward the ceiling and flatten your lower back into the floor. ? To help with this exercise, you may place a small towel under your lower back and try to push your back into the towel. 3. Hold for __________ seconds. 4. Let your muscles relax completely before you  repeat this exercise. Repeat __________ times. Complete this exercise __________ times a day. Exercise E: Alternating arm and leg raises  1. Get on your hands and knees on a firm surface. If you are on a hard floor, you may want to use padding to cushion your knees, such as an exercise mat. 2. Line up your arms and legs. Your hands should be below your shoulders, and your knees should be below your hips. 3. Lift your left leg behind you. At the same time, raise your right arm  and straighten it in front of you. ? Do not lift your leg higher than your hip. ? Do not lift your arm higher than your shoulder. ? Keep your abdominal and back muscles tight. ? Keep your hips facing the ground. ? Do not arch your back. ? Keep your balance carefully, and do not hold your breath. 4. Hold for __________ seconds. 5. Slowly return to the starting position and repeat with your right leg and your left arm. Repeat __________ times. Complete this exercise __________ times a day. Posture and body mechanics  Body mechanics refers to the movements and positions of your body while you do your daily activities. Posture is part of body mechanics. Good posture and healthy body mechanics can help to relieve stress in your body's tissues and joints. Good posture means that your spine is in its natural S-curve position (your spine is neutral), your shoulders are pulled back slightly, and your head is not tipped forward. The following are general guidelines for applying improved posture and body mechanics to your everyday activities. Standing   When standing, keep your spine neutral and your feet about hip-width apart. Keep a slight bend in your knees. Your ears, shoulders, and hips should line up.  When you do a task in which you stand in one place for a long time, place one foot up on a stable object that is 2-4 inches (5-10 cm) high, such as a footstool. This helps keep your spine neutral. Sitting   When sitting, keep your spine neutral and keep your feet flat on the floor. Use a footrest, if necessary, and keep your thighs parallel to the floor. Avoid rounding your shoulders, and avoid tilting your head forward.  When working at a desk or a computer, keep your desk at a height where your hands are slightly lower than your elbows. Slide your chair under your desk so you are close enough to maintain good posture.  When working at a computer, place your monitor at a height where you are  looking straight ahead and you do not have to tilt your head forward or downward to look at the screen. Resting   When lying down and resting, avoid positions that are most painful for you.  If you have pain with activities such as sitting, bending, stooping, or squatting (flexion-based activities), lie in a position in which your body does not bend very much. For example, avoid curling up on your side with your arms and knees near your chest (fetal position).  If you have pain with activities such as standing for a long time or reaching with your arms (extension-based activities), lie with your spine in a neutral position and bend your knees slightly. Try the following positions: ? Lying on your side with a pillow between your knees. ? Lying on your back with a pillow under your knees. Lifting   When lifting objects, keep your feet at least shoulder-width apart and tighten your abdominal  muscles.  Bend your knees and hips and keep your spine neutral. It is important to lift using the strength of your legs, not your back. Do not lock your knees straight out.  Always ask for help to lift heavy or awkward objects. This information is not intended to replace advice given to you by your health care provider. Make sure you discuss any questions you have with your health care provider. Document Released: 12/19/2004 Document Revised: 08/26/2015 Document Reviewed: 09/04/2014 Elsevier Interactive Patient Education  [employer].

## 2017-07-02 NOTE — Progress Notes (Signed)
Patient presents to clinic today for follow-up of insomnia and anxiety. Patient is currently on a regimen of Xanax 0.25 mg PRN for acute anxiety. Is taking as needed with good relief. Tries to limit use. Denies depressed mood or anhedonia. Denies SI/HI. In terms of sleep notes great sleep onset with CR ambien 6.25 mg. Notes though now waking up at 4 AM and unable to go back to sleep despite trying his best.   Patient also with history of DJD of lumbar spine. Notes pain in R lower back over the past couple of weeks, radiating into RLE. Denies any trauma or injury. Symptoms are worse with prolonged standing. Denies any saddle anesthesia or change to bowel/blader habits.    Past Medical History:  Diagnosis Date  . History of chicken pox   . LBP (low back pain)     Current Outpatient Medications on File Prior to Visit  Medication Sig Dispense Refill  . ALPRAZolam (XANAX) 0.25 MG tablet Take 1 tablet (0.25 mg total) by mouth 2 (two) times daily as needed. No further refills without follow-up. 20 tablet 0  . ibuprofen (ADVIL,MOTRIN) 200 MG tablet Take 200 mg by mouth every 6 (six) hours as needed.    . Multiple Vitamins-Minerals (MENS MULTIVITAMIN PLUS) TABS Take by mouth daily.    Marland Kitchen. zolpidem (AMBIEN CR) 6.25 MG CR tablet Take 1 tablet (6.25 mg total) by mouth at bedtime as needed for sleep. 30 tablet 0  . ketoconazole (NIZORAL) 2 % shampoo Apply 1 application topically 2 (two) times a week. For 2-4 weeks. (Patient not taking: Reported on 07/02/2017) 120 mL 0  . traMADol (ULTRAM) 50 MG tablet Take 1 tablet (50 mg total) by mouth every 12 (twelve) hours as needed. (Patient not taking: Reported on 07/02/2017) 14 tablet 0   No current facility-administered medications on file prior to visit.     Allergies  Allergen Reactions  . Celebrex [Celecoxib] Palpitations    Family History  Problem Relation Age of Onset  . Kidney Stones Mother   . Hepatitis C Father   . Diabetes Father   . Hypertension  Father   . Diabetes Maternal Grandmother   . Breast cancer Paternal Grandmother        Metastatic  . Heart disease Paternal Grandfather   . Heart attack Paternal Grandfather     Social History   Socioeconomic History  . Marital status: Single    Spouse name: Not on file  . Number of children: 0  . Years of education: Not on file  . Highest education level: Not on file  Occupational History  . Occupation: [employer]     Comment: Consulting civil engineerhotolab supervisor  Social Needs  . Financial resource strain: Not on file  . Food insecurity:    Worry: Not on file    Inability: Not on file  . Transportation needs:    Medical: Not on file    Non-medical: Not on file  Tobacco Use  . Smoking status: Current Every Day Smoker    Packs/day: 0.25    Years: 25.00    Pack years: 6.25    Types: Cigarettes  . Smokeless tobacco: Never Used  Substance and Sexual Activity  . Alcohol use: Yes    Comment: social -- maybe 1-2 per week  . Drug use: No  . Sexual activity: Not Currently  Lifestyle  . Physical activity:    Days per week: Not on file    Minutes per session: Not on file  .  Stress: Not on file  Relationships  . Social connections:    Talks on phone: Not on file    Gets together: Not on file    Attends religious service: Not on file    Active member of club or organization: Not on file    Attends meetings of clubs or organizations: Not on file    Relationship status: Not on file  Other Topics Concern  . Not on file  Social History Narrative  . Not on file   Review of Systems - See HPI.  All other ROS are negative.  BP 118/80   Pulse (!) 101   Temp 98.2 F (36.8 C) (Oral)   Resp 16   Ht 5\' 7"  (1.702 m)   Wt 142 lb 12.8 oz (64.8 kg)   SpO2 98%   BMI 22.37 kg/m   Physical Exam  Constitutional: He is oriented to person, place, and time. He appears well-developed and well-nourished.  HENT:  Head: Normocephalic and atraumatic.  Eyes: Pupils are equal, round, and reactive to light.    Cardiovascular: Normal rate, regular rhythm, normal heart sounds and intact distal pulses.  Pulmonary/Chest: Effort normal.  Musculoskeletal:       Lumbar back: He exhibits pain. He exhibits normal range of motion, no tenderness and no bony tenderness.  Neurological: He is alert and oriented to person, place, and time.  Vitals reviewed.   Assessment/Plan: 1. Sciatica of right side Start Medrol pack. Start stretches. Handout given. If not improving, will need repeat imaging and Ortho assessment.  - methylPREDNISolone (MEDROL DOSEPAK) 4 MG TBPK tablet; Take following package directions.  Dispense: 21 tablet; Refill: 0  2. Insomnia secondary to anxiety Increase Ambien CR to 12.5 mg nightly.   3. Anxiety Stable. Up-to-date on CSC and UDS. Refill given.    Piedad Climes, PA-C

## 2017-07-18 ENCOUNTER — Other Ambulatory Visit: Payer: Self-pay | Admitting: Physician Assistant

## 2017-07-18 NOTE — Telephone Encounter (Signed)
Xanax last filled 07/02/17 #20 Last OV: 07/02/17 CSC: Up to date 03/06/17 UDS: up to date 03/06/17  Please advise

## 2017-07-21 IMAGING — DX DG LUMBAR SPINE COMPLETE 4+V
5 series · 5 of 5 positions shown · non-contrast
Comparison: None.

CLINICAL DATA: Chronic right-sided back pain radiating into the
right hip, no known injury, initial encounter

EXAM:
LUMBAR SPINE - COMPLETE 4+ VIEW

[l-spine ap]
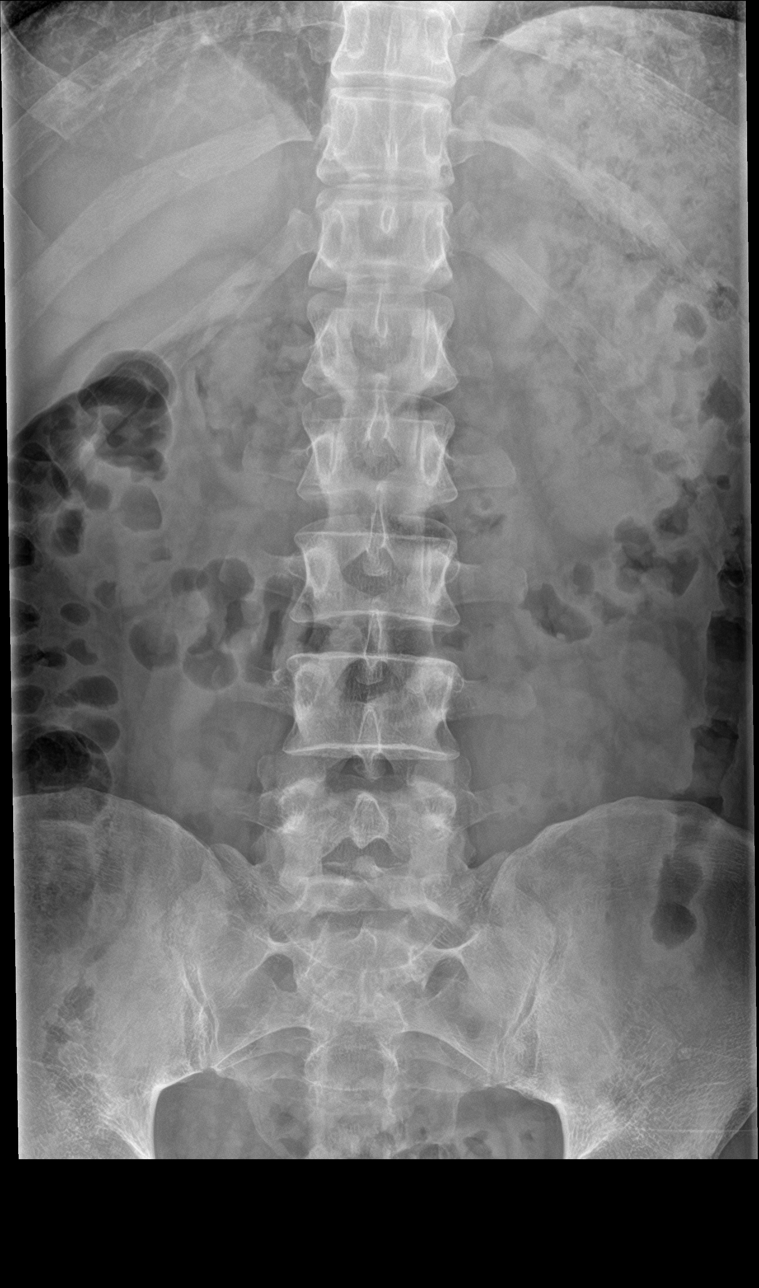

[l-spine obl (1 of 2)]
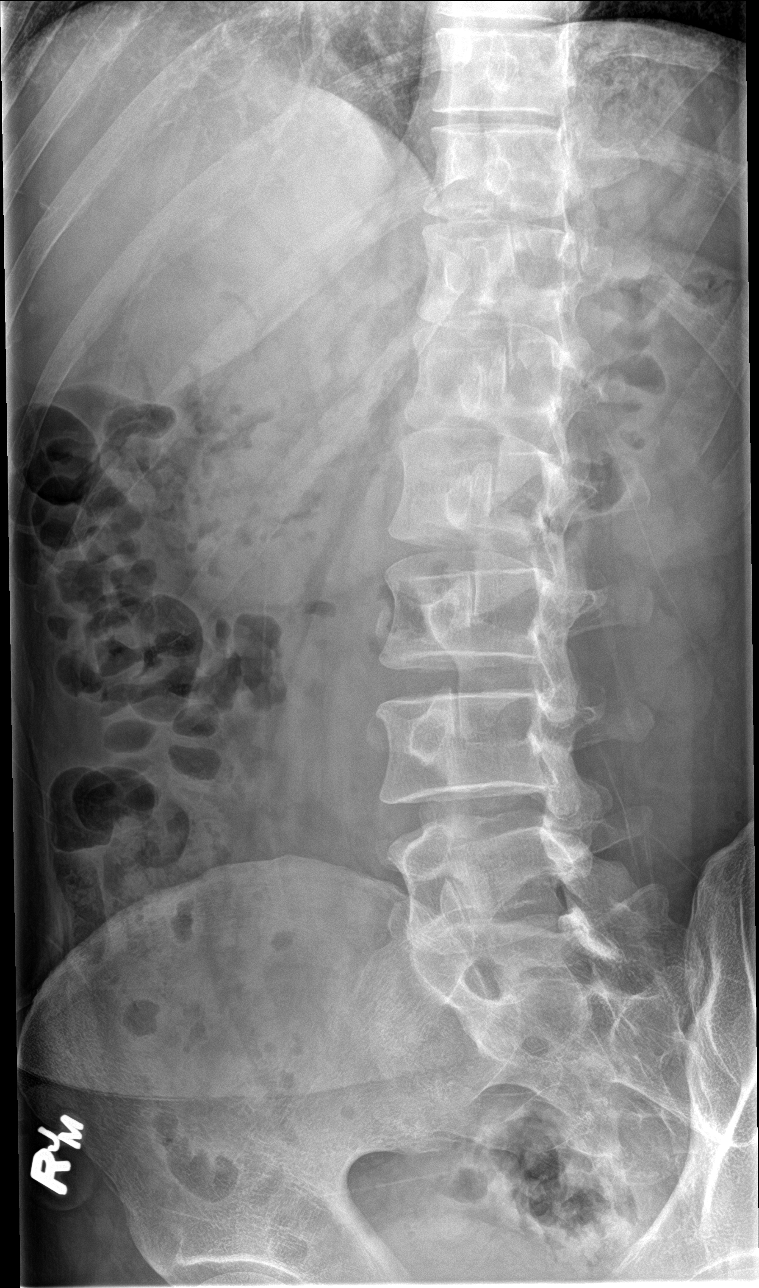

[l-spine obl (2 of 2)]
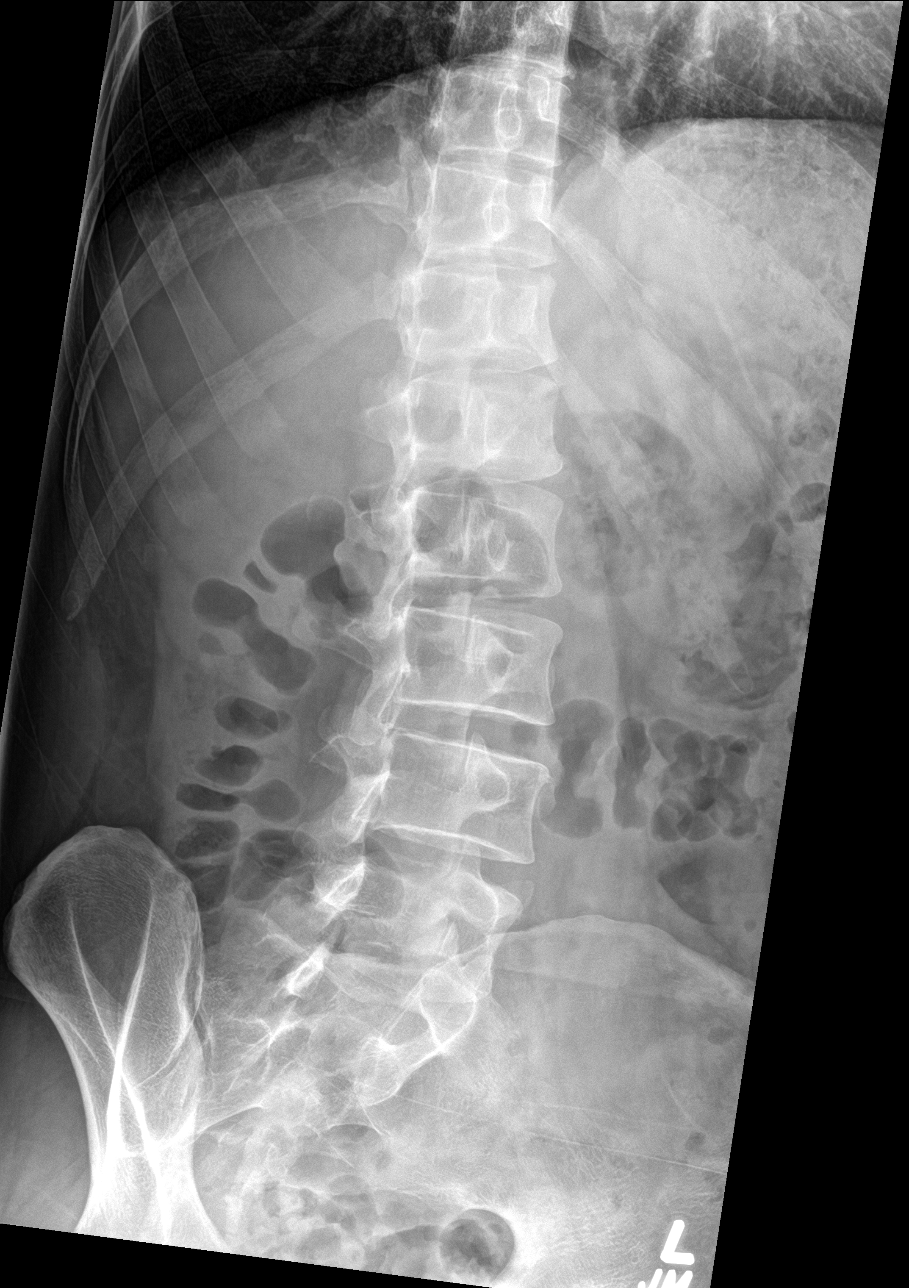

[l-spine lat]
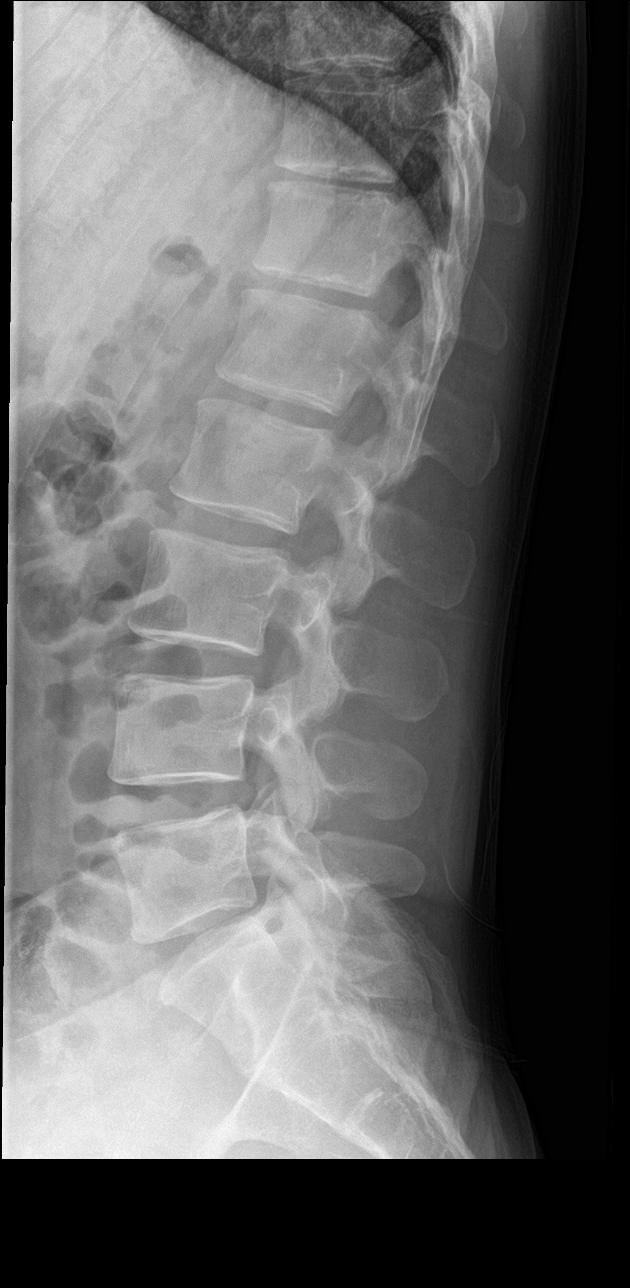

[l-spine spot]
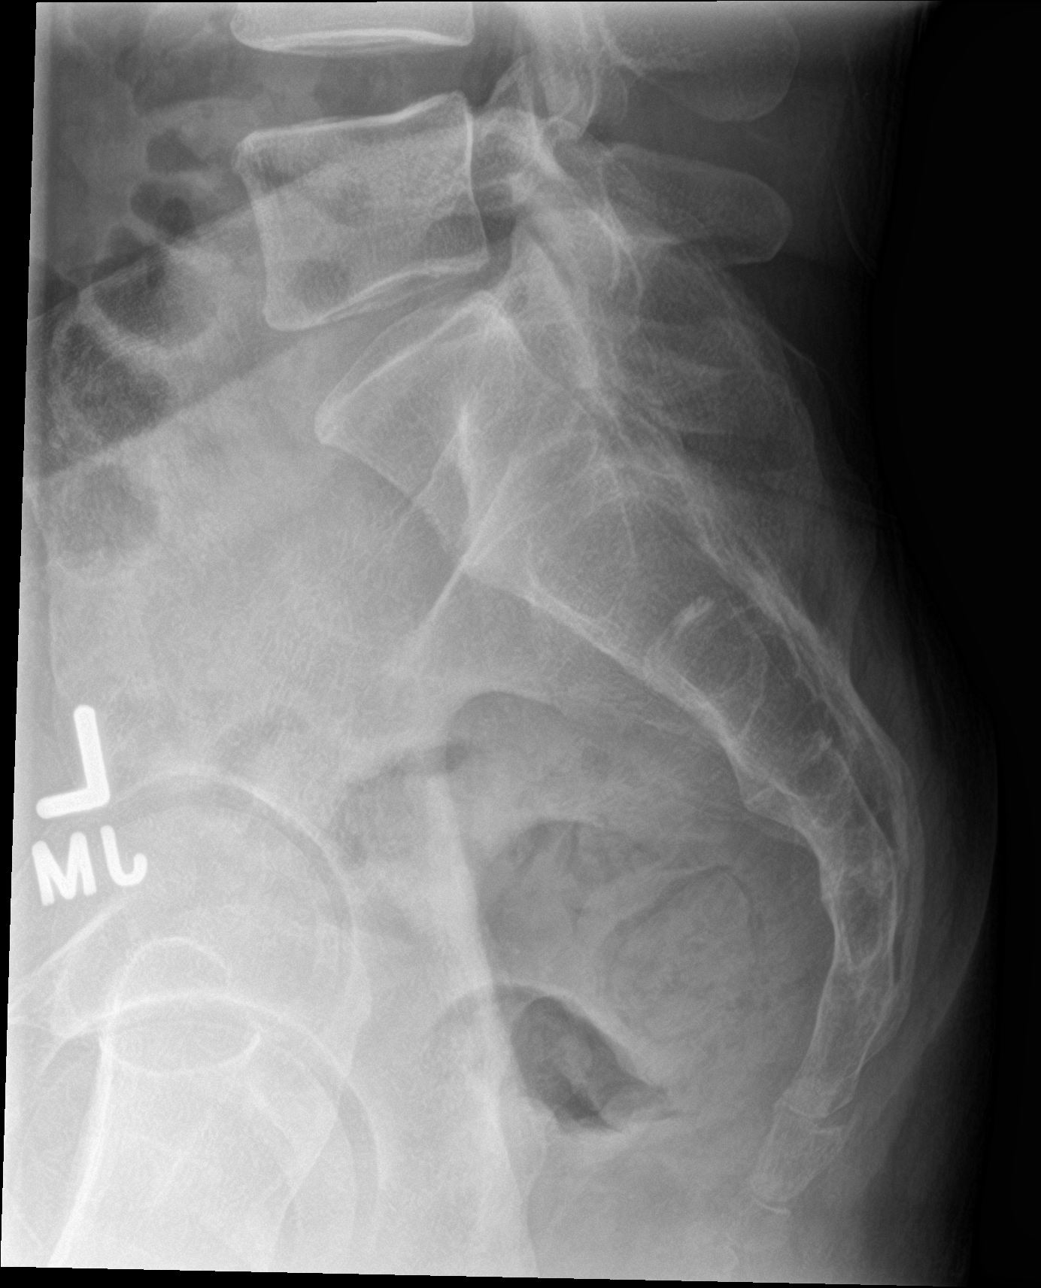

[5 of 5 positions shown; findings below may reference images not displayed]

FINDINGS: Five lumbar type vertebral bodies are well visualized. Vertebral
body height is well maintained. No pars defects are seen. No
anterolisthesis is noted. No significant disc space narrowing is
seen. The overlying soft tissues are within normal limits.
IMPRESSION: No acute abnormality noted.

## 2017-07-21 IMAGING — DX DG HIP (WITH OR WITHOUT PELVIS) 2-3V*R*
3 series · 3 of 3 positions shown · non-contrast
Comparison: None.

CLINICAL DATA: Right hip pain, no known injury, initial encounter

EXAM:
DG HIP (WITH OR WITHOUT PELVIS) 2-3V RIGHT

[pelvis ap]
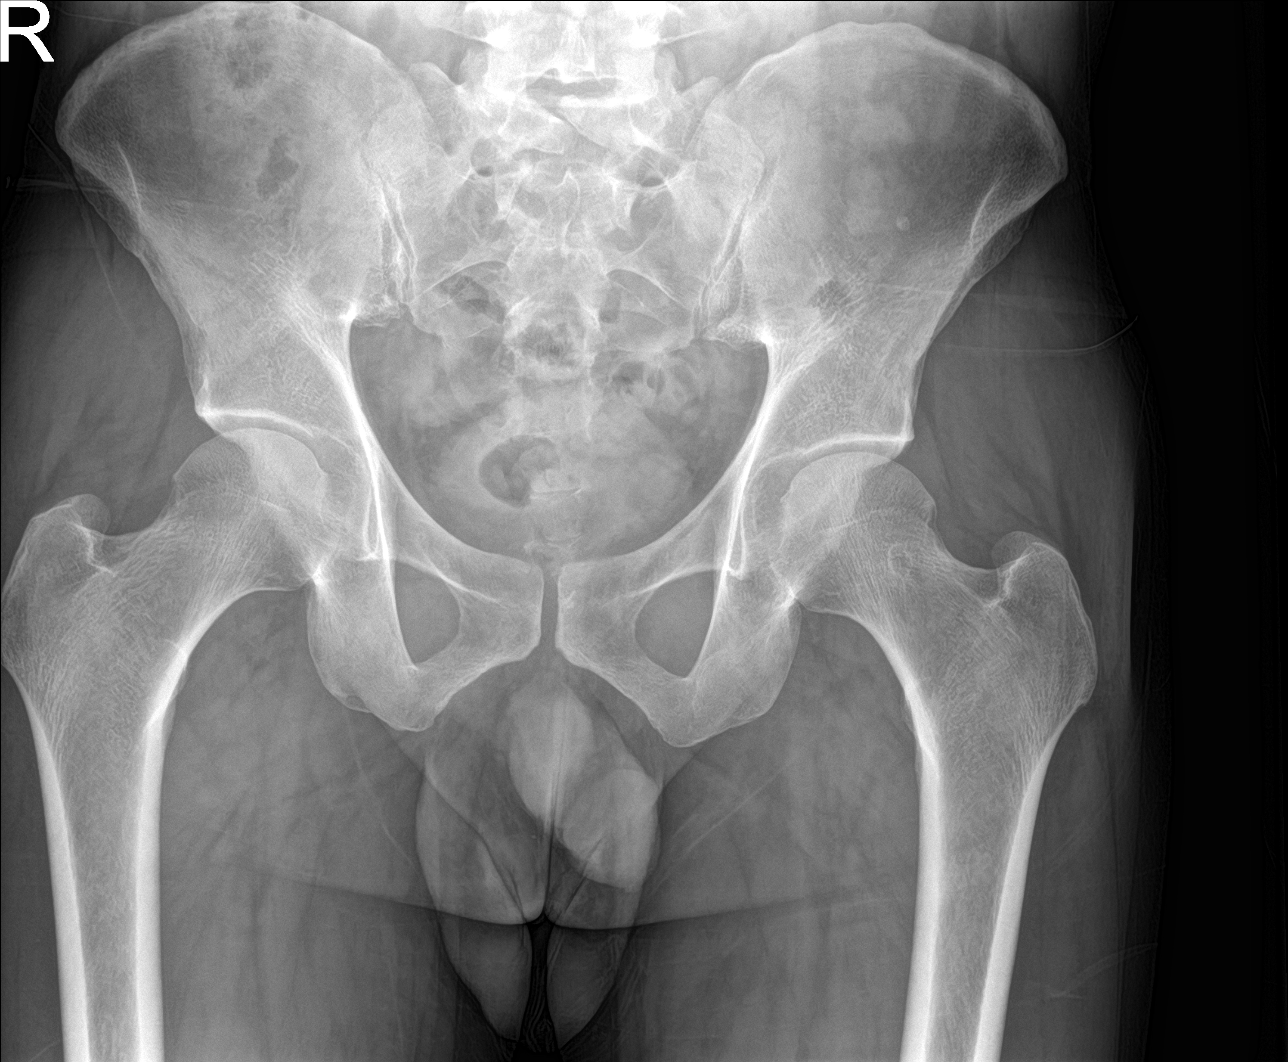

[hip ap]
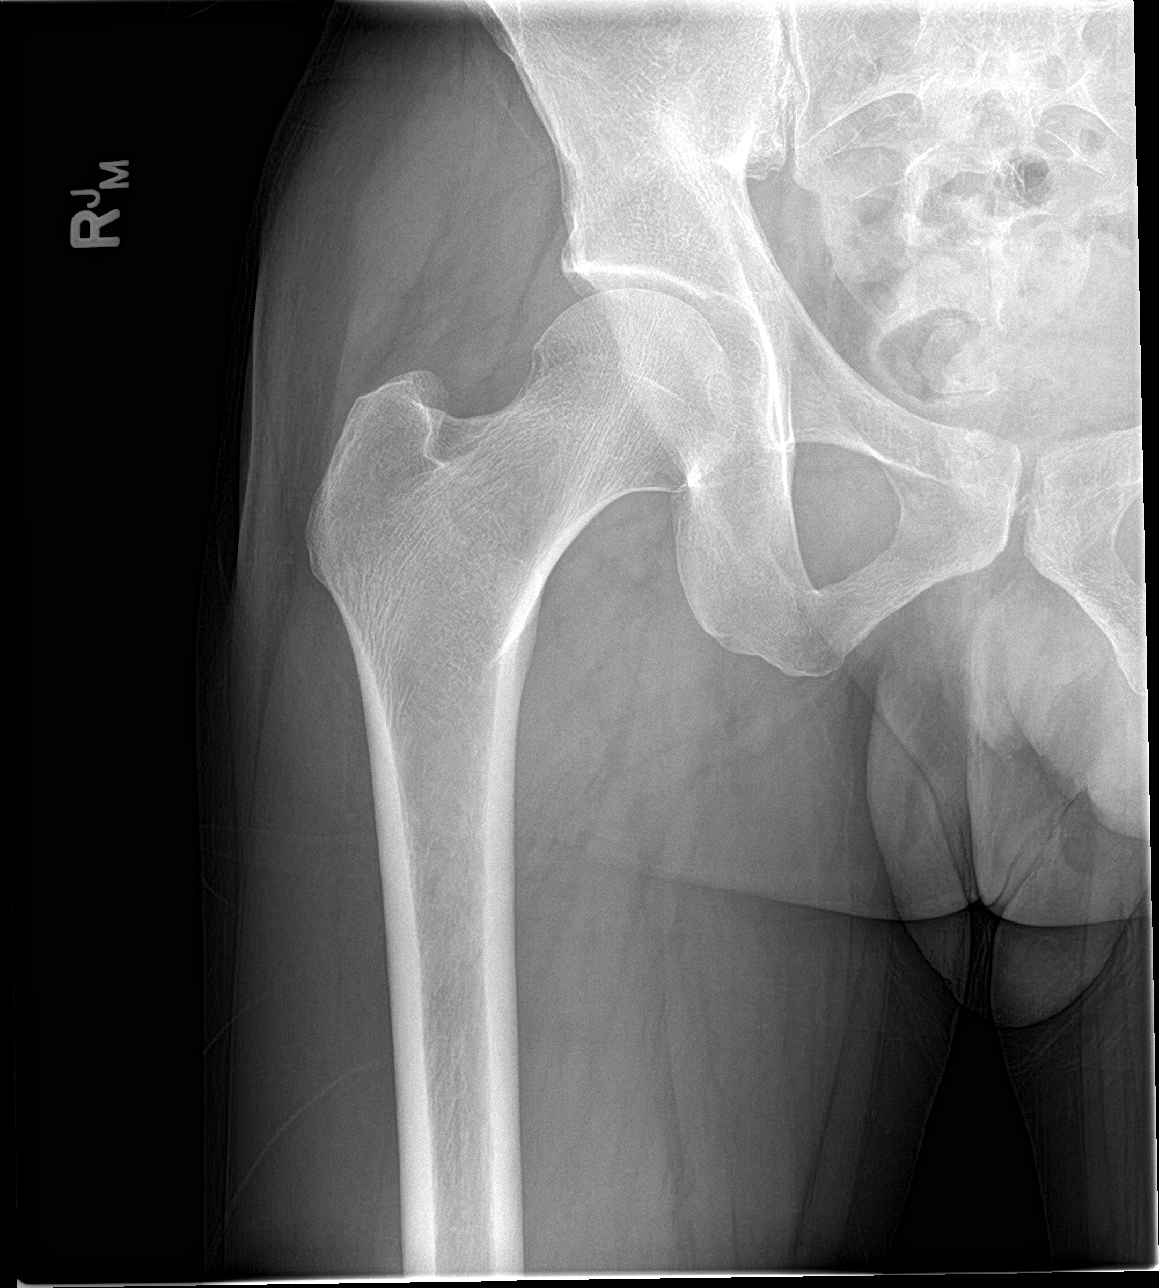

[hip lat]
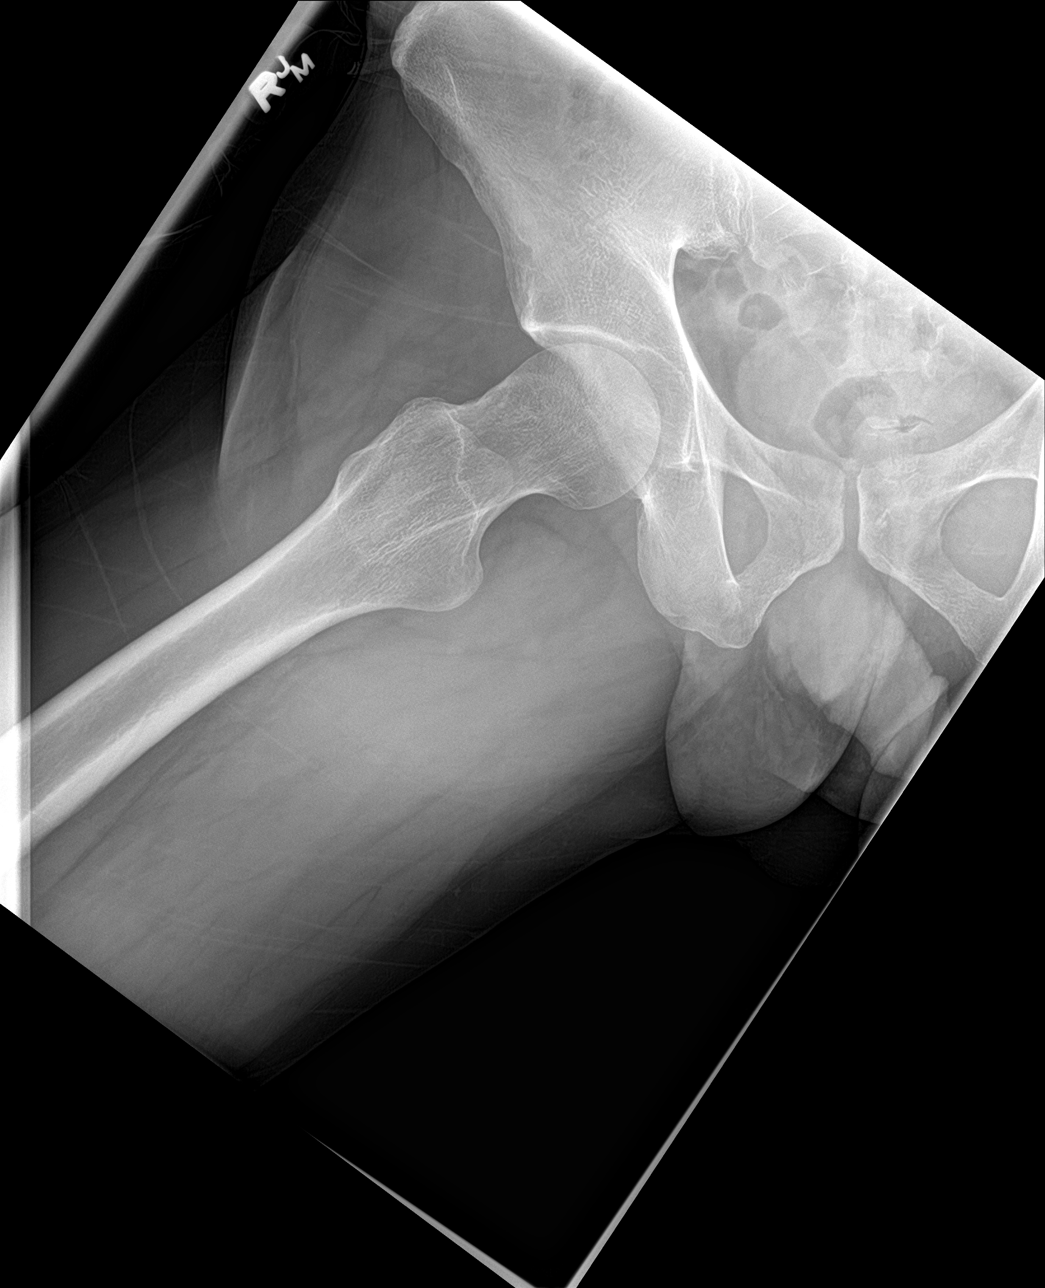

[3 of 3 positions shown; findings below may reference images not displayed]

FINDINGS: No acute fracture or dislocation is noted. Posterior fusion defect
is noted at S1. No soft tissue abnormality is seen.
IMPRESSION: No acute abnormality noted.

## 2017-08-07 ENCOUNTER — Other Ambulatory Visit: Payer: Self-pay | Admitting: Physician Assistant

## 2017-08-07 NOTE — Telephone Encounter (Signed)
Last OV 07/02/17, No future OV  Last filled 07/18/17, # 30 with 0 refills

## 2017-08-20 ENCOUNTER — Other Ambulatory Visit: Payer: Self-pay | Admitting: Physician Assistant

## 2017-08-20 NOTE — Telephone Encounter (Signed)
Last OV: 07/02/2017 Last Fill: 07/02/2017

## 2017-08-27 ENCOUNTER — Other Ambulatory Visit: Payer: Self-pay | Admitting: Physician Assistant

## 2017-08-27 NOTE — Telephone Encounter (Signed)
Last OV 07/02/17, No future OV  Last filled 08/08/17, # 30 with 0 refill

## 2017-09-04 ENCOUNTER — Encounter: Payer: Self-pay | Admitting: Physician Assistant

## 2017-09-04 ENCOUNTER — Ambulatory Visit (INDEPENDENT_AMBULATORY_CARE_PROVIDER_SITE_OTHER): Payer: 59 | Admitting: Physician Assistant

## 2017-09-04 ENCOUNTER — Other Ambulatory Visit: Payer: Self-pay

## 2017-09-04 VITALS — BP 138/86 | HR 107 | Temp 98.3°F | Resp 17 | Ht 67.0 in | Wt 143.8 lb

## 2017-09-04 DIAGNOSIS — F411 Generalized anxiety disorder: Secondary | ICD-10-CM

## 2017-09-04 MED ORDER — ALPRAZOLAM 0.25 MG PO TABS: ORAL_TABLET | ORAL | 2 refills | Status: DC

## 2017-09-04 NOTE — Patient Instructions (Signed)
Good luck with house hunting! I have sent in your refill of alprazolam. We will need to meet every 6 months to continue this prescription.   Return sooner if needed.

## 2017-09-04 NOTE — Progress Notes (Signed)
Patient presents to clinic today for follow-up of anxiety.  Patient has a history of multiple medication intolerances.  As such is only taking Xanax 0.25 mg up to twice daily as needed for acute anxiety.  Patient has done very well on this regimen for some time.  Has noted some recent increase in stressors, both at home and work.  Is having to find a new place to live due to termite infestation at home he currently rents.  Notes he is sleeping and eating well.  He is hydrating well.  Controlled substance contract is on file and up-to-date.  UDS has been obtained within the past year.   Past Medical History:  Diagnosis Date  . History of chicken pox   . LBP (low back pain)     Current Outpatient Medications on File Prior to Visit  Medication Sig Dispense Refill  . ALPRAZolam (XANAX) 0.25 MG tablet TAKE 1 TABLET BY MOUTH 2 TIMES DAILY AS NEEDED. NO FURTHER REFILLS WITHOUT FOLLOW-UP. 30 tablet 0  . ibuprofen (ADVIL,MOTRIN) 200 MG tablet Take 200 mg by mouth every 6 (six) hours as needed.    . Multiple Vitamins-Minerals (MENS MULTIVITAMIN PLUS) TABS Take by mouth daily.    Marland Kitchen zolpidem (AMBIEN CR) 12.5 MG CR tablet TAKE 1 TABLET BY MOUTH AT BEDTIME AS NEEDED FOR SLEEP. 30 tablet 0   No current facility-administered medications on file prior to visit.     Allergies  Allergen Reactions  . Celebrex [Celecoxib] Palpitations    Family History  Problem Relation Age of Onset  . Kidney Stones Mother   . Hepatitis C Father   . Diabetes Father   . Hypertension Father   . Diabetes Maternal Grandmother   . Breast cancer Paternal Grandmother        Metastatic  . Heart disease Paternal Grandfather   . Heart attack Paternal Grandfather     Social History   Socioeconomic History  . Marital status: Single    Spouse name: Not on file  . Number of children: 0  . Years of education: Not on file  . Highest education level: Not on file  Occupational History  . Occupation: [employer]     Comment:  Consulting civil engineer  Social Needs  . Financial resource strain: Not on file  . Food insecurity:    Worry: Not on file    Inability: Not on file  . Transportation needs:    Medical: Not on file    Non-medical: Not on file  Tobacco Use  . Smoking status: Current Every Day Smoker    Packs/day: 0.25    Years: 25.00    Pack years: 6.25    Types: Cigarettes  . Smokeless tobacco: Never Used  Substance and Sexual Activity  . Alcohol use: Yes    Comment: social -- maybe 1-2 per week  . Drug use: No  . Sexual activity: Not Currently  Lifestyle  . Physical activity:    Days per week: Not on file    Minutes per session: Not on file  . Stress: Not on file  Relationships  . Social connections:    Talks on phone: Not on file    Gets together: Not on file    Attends religious service: Not on file    Active member of club or organization: Not on file    Attends meetings of clubs or organizations: Not on file    Relationship status: Not on file  Other Topics Concern  . Not on  file  Social History Narrative  . Not on file   Review of Systems - See HPI.  All other ROS are negative.  BP 138/86   Pulse (!) 107   Temp 98.3 F (36.8 C) (Oral)   Resp 17   Ht 5\' 7"  (1.702 m)   Wt 143 lb 12.8 oz (65.2 kg)   SpO2 99%   BMI 22.52 kg/m   Physical Exam  Constitutional: He appears well-developed and well-nourished.  HENT:  Head: Normocephalic and atraumatic.  Eyes: Conjunctivae are normal.  Neck: Neck supple.  Cardiovascular: Normal rate, regular rhythm, normal heart sounds and intact distal pulses.  Pulmonary/Chest: Effort normal and breath sounds normal.  Psychiatric: He has a normal mood and affect.  Vitals reviewed.  Assessment/Plan: 1. Anxiety state CSA on file and up-to-date.  Will need renewal in March 2020.  Controlled substance database reviewed no red flags.  Will continue current regimen.  Medications refilled.   Piedad Climes, PA-C

## 2017-09-30 ENCOUNTER — Other Ambulatory Visit: Payer: Self-pay | Admitting: Physician Assistant

## 2017-10-19 ENCOUNTER — Other Ambulatory Visit: Payer: Self-pay | Admitting: Physician Assistant

## 2017-10-19 NOTE — Telephone Encounter (Signed)
Patient should still have refills available at the pharmacy. I left a detailed message for patient about this.  If he calls back, okay for PEC to let him know he needs to contact pharmacy for refiils.

## 2017-10-19 NOTE — Telephone Encounter (Signed)
Pt should have refills remaining on his September prescription

## 2017-10-19 NOTE — Telephone Encounter (Signed)
Last Filled: 09/04/17 #30, 2 Last OV: 09/04/17

## 2017-10-22 NOTE — Telephone Encounter (Signed)
Pt says he works at [employer] on file, he says he does not have any refills left.    Please advise

## 2017-10-25 ENCOUNTER — Other Ambulatory Visit: Payer: Self-pay | Admitting: Physician Assistant

## 2017-10-25 NOTE — Telephone Encounter (Signed)
Reviewed note from 09/2017; chronic xanax bid prn; refilled. Next ov due in feb.

## 2017-10-25 NOTE — Telephone Encounter (Signed)
Last OV 09/04/17 Alprazolam last filled 09/04/17 #30 with 2

## 2017-11-07 ENCOUNTER — Other Ambulatory Visit: Payer: Self-pay | Admitting: Physician Assistant

## 2017-11-27 ENCOUNTER — Other Ambulatory Visit: Payer: Self-pay | Admitting: Physician Assistant

## 2017-11-27 NOTE — Telephone Encounter (Unsigned)
Copied from CRM [id number]. Topic: Quick Communication - See Telephone Encounter >> Nov 27, 2017  1:58 PM Windy KalataMichael, Anida Deol L, NT wrote: CRM for notification. See Telephone encounter for: 11/27/17.  Patient is calling and states ALPRAZolam (XANAX) 0.25 MG tablet is out of stock at his pharmacy. He would like to know if this can be transferred to the pharmacy below.  [id number] - [place], [place] - [address] [facility] [address] [place] [id number] Phone: [phone] Fax: [phone]

## 2017-11-27 NOTE — Telephone Encounter (Signed)
Last rx Xanax 10/25/17 #60 3 RF  CSC: 03/06/17 Last OV 09/04/17 Anxiety  Please advise

## 2017-11-28 MED ORDER — ALPRAZOLAM 0.25 MG PO TABS: ORAL_TABLET | ORAL | 0 refills | Status: DC

## 2017-11-28 NOTE — Telephone Encounter (Signed)
Rx sent to requested pharmacy for 1 time fill.

## 2018-01-03 ENCOUNTER — Other Ambulatory Visit: Payer: Self-pay | Admitting: Physician Assistant

## 2018-01-03 NOTE — Telephone Encounter (Signed)
Last Ambien filled 11/08/17 #15 1 RF LOV: 09/04/17 Next OV: 03/04/18 CSC:03/06/17 UDS: 03/06/17  Please advise of refill

## 2018-02-20 DIAGNOSIS — L600 Ingrowing nail: Secondary | ICD-10-CM | POA: Diagnosis not present

## 2018-02-20 DIAGNOSIS — M79671 Pain in right foot: Secondary | ICD-10-CM | POA: Diagnosis not present

## 2018-03-04 ENCOUNTER — Encounter: Payer: Self-pay | Admitting: Physician Assistant

## 2018-03-04 ENCOUNTER — Other Ambulatory Visit: Payer: Self-pay

## 2018-03-04 ENCOUNTER — Ambulatory Visit (INDEPENDENT_AMBULATORY_CARE_PROVIDER_SITE_OTHER): Payer: 59 | Admitting: Physician Assistant

## 2018-03-04 VITALS — BP 108/80 | HR 109 | Temp 98.2°F | Resp 16 | Ht 67.0 in | Wt 136.0 lb

## 2018-03-04 DIAGNOSIS — F5105 Insomnia due to other mental disorder: Secondary | ICD-10-CM | POA: Diagnosis not present

## 2018-03-04 DIAGNOSIS — F419 Anxiety disorder, unspecified: Secondary | ICD-10-CM | POA: Diagnosis not present

## 2018-03-04 DIAGNOSIS — Z0000 Encounter for general adult medical examination without abnormal findings: Secondary | ICD-10-CM | POA: Diagnosis not present

## 2018-03-04 DIAGNOSIS — Z23 Encounter for immunization: Secondary | ICD-10-CM | POA: Diagnosis not present

## 2018-03-04 DIAGNOSIS — F411 Generalized anxiety disorder: Secondary | ICD-10-CM | POA: Diagnosis not present

## 2018-03-04 DIAGNOSIS — F410 Panic disorder [episodic paroxysmal anxiety] without agoraphobia: Secondary | ICD-10-CM | POA: Insufficient documentation

## 2018-03-04 LAB — COMPREHENSIVE METABOLIC PANEL
ALT: 31 U/L (ref 0–53)
AST: 19 U/L (ref 0–37)
Albumin: 4.7 g/dL (ref 3.5–5.2)
Alkaline Phosphatase: 73 U/L (ref 39–117)
BILIRUBIN TOTAL: 0.9 mg/dL (ref 0.2–1.2)
BUN: 20 mg/dL (ref 6–23)
CO2: 25 mEq/L (ref 19–32)
Calcium: 9.7 mg/dL (ref 8.4–10.5)
Chloride: 102 mEq/L (ref 96–112)
Creatinine, Ser: 0.96 mg/dL (ref 0.40–1.50)
GFR: 85.46 mL/min (ref >60.00)
Glucose, Bld: 89 mg/dL (ref 70–99)
Potassium: 4.2 mEq/L (ref 3.5–5.1)
SODIUM: 138 meq/L (ref 135–145)
Total Protein: 6.9 g/dL (ref 6.0–8.3)

## 2018-03-04 LAB — CBC WITH DIFFERENTIAL/PLATELET
BASOS ABS: 0 10*3/uL (ref 0.0–0.1)
Basophils Relative: 0.6 % (ref 0.0–3.0)
Eosinophils Absolute: 0.3 10*3/uL (ref 0.0–0.7)
Eosinophils Relative: 3.3 % (ref 0.0–5.0)
HCT: 47.5 % (ref 39.0–52.0)
Hemoglobin: 16.7 g/dL (ref 13.0–17.0)
Lymphocytes Relative: 20.8 % (ref 12.0–46.0)
Lymphs Abs: 1.7 10*3/uL (ref 0.7–4.0)
MCHC: 35.2 g/dL (ref 30.0–36.0)
MCV: 93.5 fl (ref 78.0–100.0)
Monocytes Absolute: 0.7 10*3/uL (ref 0.1–1.0)
Monocytes Relative: 8.3 % (ref 3.0–12.0)
Neutro Abs: 5.5 10*3/uL (ref 1.4–7.7)
Neutrophils Relative %: 67 % (ref 43.0–77.0)
Platelets: 348 10*3/uL (ref 150.0–400.0)
RBC: 5.08 Mil/uL (ref 4.22–5.81)
RDW: 12.5 % (ref 11.5–15.5)
WBC: 8.2 10*3/uL (ref 4.0–10.5)

## 2018-03-04 LAB — LIPID PANEL
Cholesterol: 183 mg/dL (ref 0–200)
HDL: 50.5 mg/dL (ref >39.00)
LDL Cholesterol: 106 mg/dL — ABNORMAL HIGH (ref 0–99)
NonHDL: 132.2
Total CHOL/HDL Ratio: 4
Triglycerides: 129 mg/dL (ref 0.0–149.0)
VLDL: 25.8 mg/dL (ref 0.0–40.0)

## 2018-03-04 LAB — HEMOGLOBIN A1C: Hgb A1c MFr Bld: 5.1 % (ref 4.6–6.5)

## 2018-03-04 MED ORDER — FLUOXETINE HCL 20 MG PO TABS: 20.0000 mg | ORAL_TABLET | Freq: Every day | ORAL | 3 refills | Status: DC

## 2018-03-04 NOTE — Patient Instructions (Signed)
Please go to the lab for blood work.   Our office will call you with your results unless you have chosen to receive results via MyChart.  If your blood work is normal we will follow-up each year for physicals and as scheduled for chronic medical problems.  If anything is abnormal we will treat accordingly and get you in for a follow-up.  Start the Fluoxetine, taking daily as directed. Download the Calm app on your phone to help with your anxiety. Follow-up with me in 4-6 weeks.  Return sooner if needed.   Preventive Care 40-64 Years, Male Preventive care refers to lifestyle choices and visits with your health care provider that can promote health and wellness. What does preventive care include?   A yearly physical exam. This is also called an annual well check.  Dental exams once or twice a year.  Routine eye exams. Ask your health care provider how often you should have your eyes checked.  Personal lifestyle choices, including: ? Daily care of your teeth and gums. ? Regular physical activity. ? Eating a healthy diet. ? Avoiding tobacco and drug use. ? Limiting alcohol use. ? Practicing safe sex. ? Taking low-dose aspirin every day starting at age 34. What happens during an annual well check? The services and screenings done by your health care provider during your annual well check will depend on your age, overall health, lifestyle risk factors, and family history of disease. Counseling Your health care provider may ask you questions about your:  Alcohol use.  Tobacco use.  Drug use.  Emotional well-being.  Home and relationship well-being.  Sexual activity.  Eating habits.  Work and work Statistician. Screening You may have the following tests or measurements:  Height, weight, and BMI.  Blood pressure.  Lipid and cholesterol levels. These may be checked every 5 years, or more frequently if you are over 79 years old.  Skin check.  Lung cancer screening.  You may have this screening every year starting at age 20 if you have a 30-pack-year history of smoking and currently smoke or have quit within the past 15 years.  Colorectal cancer screening. All adults should have this screening starting at age 29 and continuing until age 15. Your health care provider may recommend screening at age 39. You will have tests every 1-10 years, depending on your results and the type of screening test. People at increased risk should start screening at an earlier age. Screening tests may include: ? Guaiac-based fecal occult blood testing. ? Fecal immunochemical test (FIT). ? Stool DNA test. ? Virtual colonoscopy. ? Sigmoidoscopy. During this test, a flexible tube with a tiny camera (sigmoidoscope) is used to examine your rectum and lower colon. The sigmoidoscope is inserted through your anus into your rectum and lower colon. ? Colonoscopy. During this test, a long, thin, flexible tube with a tiny camera (colonoscope) is used to examine your entire colon and rectum.  Prostate cancer screening. Recommendations will vary depending on your family history and other risks.  Hepatitis C blood test.  Hepatitis B blood test.  Sexually transmitted disease (STD) testing.  Diabetes screening. This is done by checking your blood sugar (glucose) after you have not eaten for a while (fasting). You may have this done every 1-3 years. Discuss your test results, treatment options, and if necessary, the need for more tests with your health care provider. Vaccines Your health care provider may recommend certain vaccines, such as:  Influenza vaccine. This is recommended every year.  Tetanus, diphtheria, and acellular pertussis (Tdap, Td) vaccine. You may need a Td booster every 10 years.  Varicella vaccine. You may need this if you have not been vaccinated.  Zoster vaccine. You may need this after age 7.  Measles, mumps, and rubella (MMR) vaccine. You may need at least one  dose of MMR if you were born in 1957 or later. You may also need a second dose.  Pneumococcal 13-valent conjugate (PCV13) vaccine. You may need this if you have certain conditions and have not been vaccinated.  Pneumococcal polysaccharide (PPSV23) vaccine. You may need one or two doses if you smoke cigarettes or if you have certain conditions.  Meningococcal vaccine. You may need this if you have certain conditions.  Hepatitis A vaccine. You may need this if you have certain conditions or if you travel or work in places where you may be exposed to hepatitis A.  Hepatitis B vaccine. You may need this if you have certain conditions or if you travel or work in places where you may be exposed to hepatitis B.  Haemophilus influenzae type b (Hib) vaccine. You may need this if you have certain risk factors. Talk to your health care provider about which screenings and vaccines you need and how often you need them. This information is not intended to replace advice given to you by your health care provider. Make sure you discuss any questions you have with your health care provider. Document Released: 01/15/2015 Document Revised: 02/08/2017 Document Reviewed: 10/20/2014 Elsevier Interactive Patient Education  2019 [employer].

## 2018-03-04 NOTE — Assessment & Plan Note (Signed)
Breakthrough symptoms. Will start trial of Fluoxetine 20 mg daily. Continue Alprazolam as directed when needed. Recommend mindfulness training. He is to download the Calm app on phone to help with this. Follow-up 4-6 weeks scheduled.

## 2018-03-04 NOTE — Assessment & Plan Note (Signed)
Depression screen negative. Health Maintenance reviewed -- TDaP updated today. Preventive schedule discussed and handout given in AVS. Will obtain fasting labs today.  

## 2018-03-04 NOTE — Assessment & Plan Note (Signed)
Continue current regimen

## 2018-03-04 NOTE — Progress Notes (Signed)
Patient presents to clinic today for annual exam.  Patient is fasting for labs. Body mass index is 21.3 kg/m. Endorses well-balanced diet overall.   Chronic Issues: Generalized Anxiety -- Well-controlled overall. Notes breakthrough symptoms occasionally for which he takes his Xanax. Is trying to work on stress relief tactics. Denies depressed mood or anhedonia. Denies SI/HI. Is house searching currently. Rented house with termites. Has had to leave off and on for this to be taken care of. Causing increased stressors.   Health Maintenance: Immunizations -- Due for TDaP. Agrees to this today.   Past Medical History:  Diagnosis Date  . History of chicken pox   . LBP (low back pain)     Past Surgical History:  Procedure Laterality Date  . TONSILLECTOMY      Current Outpatient Medications on File Prior to Visit  Medication Sig Dispense Refill  . ALPRAZolam (XANAX) 0.25 MG tablet TAKE 1 TABLET BY MOUTH TWICE A DAY AS NEEDED 60 tablet 0  . Multiple Vitamins-Minerals (MENS MULTIVITAMIN PLUS) TABS Take by mouth daily.    Marland Kitchen zolpidem (AMBIEN CR) 12.5 MG CR tablet TAKE 1 TABLET BY MOUTH EVERY DAY AT BEDTIME AS NEEDED FOR SLEEP 11/13/17 15 tablet 1   No current facility-administered medications on file prior to visit.     Allergies  Allergen Reactions  . Celebrex [Celecoxib] Palpitations    Family History  Problem Relation Age of Onset  . Kidney Stones Mother   . Hepatitis C Father   . Diabetes Father   . Hypertension Father   . Diabetes Maternal Grandmother   . Breast cancer Paternal Grandmother        Metastatic  . Heart disease Paternal Grandfather   . Heart attack Paternal Grandfather     Social History   Socioeconomic History  . Marital status: Single    Spouse name: Not on file  . Number of children: 0  . Years of education: Not on file  . Highest education level: Not on file  Occupational History  . Occupation: [employer]     Comment: Consulting civil engineer  Social  Needs  . Financial resource strain: Not on file  . Food insecurity:    Worry: Not on file    Inability: Not on file  . Transportation needs:    Medical: Not on file    Non-medical: Not on file  Tobacco Use  . Smoking status: Current Every Day Smoker    Packs/day: 0.25    Years: 25.00    Pack years: 6.25    Types: Cigarettes  . Smokeless tobacco: Never Used  Substance and Sexual Activity  . Alcohol use: Yes    Comment: social -- maybe 1-2 per week  . Drug use: No  . Sexual activity: Not Currently  Lifestyle  . Physical activity:    Days per week: Not on file    Minutes per session: Not on file  . Stress: Not on file  Relationships  . Social connections:    Talks on phone: Not on file    Gets together: Not on file    Attends religious service: Not on file    Active member of club or organization: Not on file    Attends meetings of clubs or organizations: Not on file    Relationship status: Not on file  . Intimate partner violence:    Fear of current or ex partner: Not on file    Emotionally abused: Not on file  Physically abused: Not on file    Forced sexual activity: Not on file  Other Topics Concern  . Not on file  Social History Narrative  . Not on file   Review of Systems  Constitutional: Negative for fever and weight loss.  HENT: Negative for ear discharge, ear pain, hearing loss and tinnitus.   Eyes: Negative for blurred vision, double vision, photophobia and pain.  Respiratory: Negative for cough and shortness of breath.   Cardiovascular: Negative for chest pain and palpitations.  Gastrointestinal: Negative for abdominal pain, blood in stool, constipation, diarrhea, heartburn, melena, nausea and vomiting.  Genitourinary: Negative for dysuria, flank pain, frequency, hematuria and urgency.  Musculoskeletal: Negative for falls.  Neurological: Negative for dizziness, loss of consciousness and headaches.  Endo/Heme/Allergies: Negative for environmental  allergies.  Psychiatric/Behavioral: Negative for depression, hallucinations, substance abuse and suicidal ideas. The patient is nervous/anxious. The patient does not have insomnia.    BP 108/80   Pulse (!) 109   Temp 98.2 F (36.8 C) (Oral)   Resp 16   Ht 5\' 7"  (1.702 m)   Wt 136 lb (61.7 kg)   SpO2 98%   BMI 21.30 kg/m   Physical Exam Vitals signs reviewed.  Constitutional:      General: He is not in acute distress.    Appearance: He is well-developed. He is not diaphoretic.  HENT:     Head: Normocephalic and atraumatic.     Right Ear: Tympanic membrane, ear canal and external ear normal.     Left Ear: Tympanic membrane, ear canal and external ear normal.     Nose: Nose normal.     Mouth/Throat:     Pharynx: No posterior oropharyngeal erythema.  Eyes:     Conjunctiva/sclera: Conjunctivae normal.     Pupils: Pupils are equal, round, and reactive to light.  Neck:     Musculoskeletal: Neck supple.     Thyroid: No thyromegaly.  Cardiovascular:     Rate and Rhythm: Normal rate and regular rhythm.     Heart sounds: Normal heart sounds.  Pulmonary:     Effort: Pulmonary effort is normal. No respiratory distress.     Breath sounds: Normal breath sounds. No wheezing or rales.  Chest:     Chest wall: No tenderness.  Abdominal:     General: Bowel sounds are normal. There is no distension.     Palpations: Abdomen is soft. There is no mass.     Tenderness: There is no abdominal tenderness. There is no guarding or rebound.  Lymphadenopathy:     Cervical: No cervical adenopathy.  Skin:    General: Skin is warm and dry.     Findings: No rash.  Neurological:     Mental Status: He is alert and oriented to person, place, and time.     Cranial Nerves: No cranial nerve deficit.    Assessment/Plan: Visit for preventive health examination Depression screen negative. Health Maintenance reviewed -- TDaP updated today. Preventive schedule discussed and handout given in AVS. Will  obtain fasting labs today.   Insomnia secondary to anxiety Continue current regimen.  GAD (generalized anxiety disorder) Breakthrough symptoms. Will start trial of Fluoxetine 20 mg daily. Continue Alprazolam as directed when needed. Recommend mindfulness training. He is to download the Calm app on phone to help with this. Follow-up 4-6 weeks scheduled.     Piedad Climes, PA-C

## 2018-03-05 ENCOUNTER — Telehealth: Payer: Self-pay

## 2018-03-05 ENCOUNTER — Other Ambulatory Visit: Payer: Self-pay | Admitting: Emergency Medicine

## 2018-03-05 DIAGNOSIS — F411 Generalized anxiety disorder: Secondary | ICD-10-CM

## 2018-03-05 MED ORDER — FLUOXETINE HCL 20 MG PO CAPS: 20.0000 mg | ORAL_CAPSULE | Freq: Every day | ORAL | 3 refills | Status: DC

## 2018-03-05 NOTE — Telephone Encounter (Signed)
Spoke with PCP and changed the Prozac tablet to capsule. Resent to the pharmacy

## 2018-03-05 NOTE — Telephone Encounter (Signed)
Copied from CRM [id number]. Topic: General - Other >> Mar 05, 2018  9:05 AM Percival Spanish wrote: Pt cal lto say the the tablet form in this med is to expensive FLUoxetine (PROZAC) 20 MG tablet,  pharmacy told pt that it comes in a capsule same mg and its cheaper and pt is asking if the capsule can be called in    Pharmacy [facility]

## 2018-03-06 ENCOUNTER — Other Ambulatory Visit: Payer: Self-pay | Admitting: Family Medicine

## 2018-03-08 NOTE — Telephone Encounter (Signed)
Medication was sent to the incorrect refill pool.   I am routing this to the correct provider.

## 2018-03-17 ENCOUNTER — Other Ambulatory Visit: Payer: Self-pay | Admitting: Family Medicine

## 2018-03-18 NOTE — Telephone Encounter (Signed)
Last refill:11/28/17 #60, 0 Last OV: 03/04/18

## 2018-04-02 ENCOUNTER — Encounter: Payer: Self-pay | Admitting: Physician Assistant

## 2018-04-02 ENCOUNTER — Ambulatory Visit (INDEPENDENT_AMBULATORY_CARE_PROVIDER_SITE_OTHER): Payer: 59 | Admitting: Physician Assistant

## 2018-04-02 ENCOUNTER — Other Ambulatory Visit: Payer: Self-pay

## 2018-04-02 DIAGNOSIS — F411 Generalized anxiety disorder: Secondary | ICD-10-CM | POA: Diagnosis not present

## 2018-04-02 MED ORDER — VENLAFAXINE HCL ER 37.5 MG PO CP24: 37.5000 mg | ORAL_CAPSULE | Freq: Every day | ORAL | 1 refills | Status: DC

## 2018-04-02 NOTE — Progress Notes (Signed)
Virtual Visit via Video Note  I connected with Vernon Sellers on 04/02/18 at 10:00 AM EDT by a video enabled telemedicine application and verified that I am speaking with the correct person using two identifiers.   I discussed the limitations of evaluation and management by telemedicine and the availability of in person appointments. The patient expressed understanding and agreed to proceed.  History of Present Illness: Patient presents today for follow-up of anxiety disorder. At last visit Fluoxetine 20 mg was added to patient's regimen. Patient endorses taking the medication as directed x 3 weeks. Notes feeling down and more irritable with medication, feeling very agitated. Denies SI/HI. Anxiety currently about 8/10. Is still taking the Alprazolam twice daily which helps with acute anxiety.    Observations/Objective: Patient is well-developed, well-nourished and in no acute distress, resting comfortably. Head is normocephalic, atraumatic.  Speech is coherent and logical without abnormality. No noted labored breathing.  Assessment and Plan: GAD (generalized anxiety disorder) Could not tolerate Fluoxetine -- will add to intolerance list. Start Effexor XR 36.5 mg daily. Continue Alprazolam as directed. Follow-up 4 weeks via video visit (appointment scheduled) for reassessment. Strict sooner return precautions reviewed with patient.   Follow Up Instructions: Video visit scheduled for April 28th 11:00AM. Reminder will be sent out closer to appointment time.    I discussed the assessment and treatment plan with the patient. The patient was provided an opportunity to ask questions and all were answered. The patient agreed with the plan and demonstrated an understanding of the instructions.   The patient was advised to call back or seek an in-person evaluation if the symptoms worsen or if the condition fails to improve as anticipated.   Piedad Climes, PA-C

## 2018-04-02 NOTE — Assessment & Plan Note (Signed)
Could not tolerate Fluoxetine -- will add to intolerance list. Start Effexor XR 36.5 mg daily. Continue Alprazolam as directed. Follow-up 4 weeks via video visit (appointment scheduled) for reassessment. Strict sooner return precautions reviewed with patient.

## 2018-04-02 NOTE — Progress Notes (Signed)
I have discussed the procedure for the virtual visit with the patient who has given consent to proceed with assessment and treatment.   Darryon Bastin S Baran Kuhrt, CMA     

## 2018-04-30 ENCOUNTER — Ambulatory Visit (INDEPENDENT_AMBULATORY_CARE_PROVIDER_SITE_OTHER): Payer: 59 | Admitting: Physician Assistant

## 2018-04-30 ENCOUNTER — Encounter: Payer: Self-pay | Admitting: Physician Assistant

## 2018-04-30 ENCOUNTER — Other Ambulatory Visit: Payer: Self-pay

## 2018-04-30 DIAGNOSIS — F419 Anxiety disorder, unspecified: Secondary | ICD-10-CM

## 2018-04-30 DIAGNOSIS — F411 Generalized anxiety disorder: Secondary | ICD-10-CM | POA: Diagnosis not present

## 2018-04-30 DIAGNOSIS — F5105 Insomnia due to other mental disorder: Secondary | ICD-10-CM | POA: Diagnosis not present

## 2018-04-30 NOTE — Assessment & Plan Note (Signed)
Taking Alprazolam as directed. Doing well overall. Unable to tolerate most antidepressant medications and anxiolytics. Will continue current regimen. Medications refilled. PDMP reviewed today.

## 2018-04-30 NOTE — Progress Notes (Signed)
   Virtual Visit via Video   I connected with patient on 04/30/18 at 11:00 AM EDT by a video enabled telemedicine application and verified that I am speaking with the correct person using two identifiers.  Location patient: Home Location provider: [place] [facility], Office Persons participating in the virtual visit: Patient, Provider, CMA (Patina Moore)  I discussed the limitations of evaluation and management by telemedicine and the availability of in person appointments. The patient expressed understanding and agreed to proceed.  Subjective:   HPI:   Patient presents via Doxy.Me for 4-week follow-up of anxiety. At last visit patient was started on Effexor XR as he noted it was a medication he did not feel he took before. Has had significant issue with SSRIs previously. Notes he realized he had taken before and had significant headaches and foggy feeling in his head. Has maintained his Alprazolam 1-2 x daily. On rare occasions has had to take a third and Ambien. Is sleeping well at night. Is adjusting to new work changes so anxiety is leveling out overall. Denies depressed mood or anhedonia.    ROS:   See pertinent positives and negatives per HPI.  Patient Active Problem List   Diagnosis Date Noted  . GAD (generalized anxiety disorder) 03/04/2018  . Visit for preventive health examination 09/06/2016  . Insomnia secondary to anxiety 08/08/2016    Social History   Tobacco Use  . Smoking status: Current Every Day Smoker    Packs/day: 0.25    Years: 25.00    Pack years: 6.25    Types: Cigarettes  . Smokeless tobacco: Never Used  Substance Use Topics  . Alcohol use: Yes    Comment: social -- maybe 1-2 per week    Current Outpatient Medications:  .  ALPRAZolam (XANAX) 0.25 MG tablet, TAKE 1 TABLET BY MOUTH TWICE A DAY AS NEEDED, Disp: 60 tablet, Rfl: 3 .  Multiple Vitamins-Minerals (MENS MULTIVITAMIN PLUS) TABS, Take by mouth daily., Disp: , Rfl:  .  zolpidem (AMBIEN CR)  12.5 MG CR tablet, TAKE 1 TABLET BY MOUTH EVERY DAY AT BEDTIME AS NEEDED FOR SLEEP 11/13/17, Disp: 15 tablet, Rfl: 1  Allergies  Allergen Reactions  . Celebrex [Celecoxib] Palpitations    Objective:   There were no vitals taken for this visit.  Patient is well-developed, well-nourished in no acute distress.  Resting comfortably in chair at home.  Head is normocephalic, atraumatic.  No labored breathing.  Speech is clear and coherent with logical content.  Patient is alert and oriented at baseline.   Assessment and Plan:   GAD (generalized anxiety disorder) Taking Alprazolam as directed. Doing well overall. Unable to tolerate most antidepressant medications and anxiolytics. Will continue current regimen. Medications refilled. PDMP reviewed today.      Piedad Climes, [place] 04/30/2018

## 2018-04-30 NOTE — Progress Notes (Signed)
I have discussed the procedure for the virtual visit with the patient who has given consent to proceed with assessment and treatment.   Kymani Laursen S Tevin Shillingford, CMA     

## 2018-05-05 ENCOUNTER — Other Ambulatory Visit: Payer: Self-pay | Admitting: Physician Assistant

## 2018-05-06 NOTE — Telephone Encounter (Signed)
Last refill:03/08/18 #15, 1 Last OV:04/30/18

## 2018-06-05 ENCOUNTER — Other Ambulatory Visit: Payer: Self-pay | Admitting: Emergency Medicine

## 2018-06-05 NOTE — Telephone Encounter (Signed)
Last rx for Xanax 03/18/18 #60 3 RF Zolpidem 05/06/18 #15 1Rf LOV: 04/30/18 CSC:03/06/17 UDS: 03/06/17

## 2018-06-06 MED ORDER — ZOLPIDEM TARTRATE ER 12.5 MG PO TBCR: EXTENDED_RELEASE_TABLET | ORAL | 1 refills | Status: DC

## 2018-06-06 MED ORDER — ALPRAZOLAM 0.25 MG PO TABS: 0.2500 mg | ORAL_TABLET | Freq: Two times a day (BID) | ORAL | 3 refills | Status: DC | PRN

## 2018-08-20 ENCOUNTER — Other Ambulatory Visit: Payer: Self-pay | Admitting: Physician Assistant

## 2018-08-20 NOTE — Telephone Encounter (Signed)
Last refill: 6.4.20 #15, 1 Last OV: 4.28.20

## 2018-11-03 ENCOUNTER — Other Ambulatory Visit: Payer: Self-pay | Admitting: Physician Assistant

## 2018-11-04 NOTE — Telephone Encounter (Signed)
Xanax last rx 06/06/18 #60 3 RF LOV: 04/30/18 Anxiety CSC: 03/06/17 UDS: 03/06/17

## 2018-11-18 ENCOUNTER — Encounter: Payer: Self-pay | Admitting: Physician Assistant

## 2018-11-19 ENCOUNTER — Encounter: Payer: Self-pay | Admitting: Physician Assistant

## 2018-11-19 ENCOUNTER — Ambulatory Visit (INDEPENDENT_AMBULATORY_CARE_PROVIDER_SITE_OTHER): Payer: 59 | Admitting: Physician Assistant

## 2018-11-19 ENCOUNTER — Other Ambulatory Visit: Payer: Self-pay

## 2018-11-19 DIAGNOSIS — M109 Gout, unspecified: Secondary | ICD-10-CM | POA: Diagnosis not present

## 2018-11-19 DIAGNOSIS — F411 Generalized anxiety disorder: Secondary | ICD-10-CM | POA: Diagnosis not present

## 2018-11-19 DIAGNOSIS — F5105 Insomnia due to other mental disorder: Secondary | ICD-10-CM | POA: Diagnosis not present

## 2018-11-19 DIAGNOSIS — F419 Anxiety disorder, unspecified: Secondary | ICD-10-CM

## 2018-11-19 MED ORDER — ZOLPIDEM TARTRATE ER 12.5 MG PO TBCR: EXTENDED_RELEASE_TABLET | ORAL | 1 refills | Status: DC

## 2018-11-19 NOTE — Progress Notes (Signed)
   Virtual Visit via Video   I connected with Vernon Sellers on 11/19/18 at  4:15 PM EST by a video enabled telemedicine application and verified that I am speaking with the correct person using two identifiers.  Location Vernon Sellers: Home Location provider: [place] [facility], Office Persons participating in the virtual visit: Vernon Sellers, Provider, [place] (Patina Moore)  I discussed the limitations of evaluation and management by telemedicine and the availability of in person appointments. The Vernon Sellers expressed understanding and agreed to proceed.  Subjective:   HPI:        Vernon Sellers presents via Doxy.Me today for follow-up of anxiety and insomnia. Is taking his Alprazolam as directed when needed for acute anxiety. Using Ambien a couple of times per week for sleep. Notes sleeping well with this medication. Insurance will only cover a certain amount of tablets per month. Notes mood is doing very well currently, able to manage his anxiety. Denies SI/HI.      Endorses an episode of pain, redness and swelling in the thumb of R hand, more distally, that seemed to happen overnight. Notes the area has mainly resolved. No residual redness, warmth, swelling. ROM within normal limits. Some mild residual ache present. Denies alcohol consumption. Notes increase in red meat. Denies history of gout or known arthritis. .  ROS:   See pertinent positives and negatives per HPI.  Vernon Sellers Active Problem List   Diagnosis Date Noted  . GAD (generalized anxiety disorder) 03/04/2018  . Visit for preventive health examination 09/06/2016  . Insomnia secondary to anxiety 08/08/2016    Social History   Tobacco Use  . Smoking status: Current Every Day Smoker    Packs/day: 0.25    Years: 25.00    Pack years: 6.25    Types: Cigarettes  . Smokeless tobacco: Never Used  Substance Use Topics  . Alcohol use: Yes    Comment: social -- maybe 1-2 per week    Current Outpatient Medications:  .  ALPRAZolam (XANAX) 0.25 MG  tablet, Take 1 tablet (0.25 mg total) by mouth 2 (two) times daily as needed. Need follow-up for further refills., Disp: 60 tablet, Rfl: 0 .  Multiple Vitamins-Minerals (MENS MULTIVITAMIN PLUS) TABS, Take by mouth daily., Disp: , Rfl:  .  zolpidem (AMBIEN CR) 12.5 MG CR tablet, TAKE 1 TABLET BY MOUTH EVERY DAY AT BEDTIME AS NEEDED FOR SLEEP, Disp: 15 tablet, Rfl: 1  Allergies  Allergen Reactions  . Celebrex [Celecoxib] Palpitations    Objective:   There were no vitals taken for this visit.  Vernon Sellers is well-developed, well-nourished in no acute distress.  Resting comfortably at home.  Head is normocephalic, atraumatic.  No labored breathing.  Speech is clear and coherent with logical content.  Vernon Sellers is alert and oriented at baseline.  Hand examined via video visit. No noted swelling, redness or decreased ROM of first phalanx.   Assessment and Plan:   1. GAD (generalized anxiety disorder) 2. Insomnia secondary to anxiety Stable. Will continue current regimen. Sent handout on other OTC options for sleep, specifically Pure ZZZs, and sleep hygiene practices reviewed. Will monitor.   3. Acute gout of right hand, unspecified cause Seems like most likely cause of symptoms. Has resolved itself. Dietary recommendations reviewed with Vernon Sellers. Increase fluids. Follow-up in-office for any recurrence.     Leeanne Rio, PA-C 11/19/2018

## 2018-11-19 NOTE — Progress Notes (Signed)
I have discussed the procedure for the virtual visit with the patient who has given consent to proceed with assessment and treatment.   Kandis Henry S Cathye Kreiter, CMA     

## 2018-12-02 ENCOUNTER — Other Ambulatory Visit: Payer: Self-pay | Admitting: Physician Assistant

## 2018-12-02 ENCOUNTER — Encounter: Payer: Self-pay | Admitting: Physician Assistant

## 2018-12-02 NOTE — Telephone Encounter (Signed)
Xanax refill 11/05/18 #60 LOV: 11/19/18 Anxiety CSC: 03/06/17

## 2018-12-29 ENCOUNTER — Other Ambulatory Visit: Payer: Self-pay | Admitting: Physician Assistant

## 2018-12-30 NOTE — Telephone Encounter (Signed)
Xanax last rx 12/02/18 #60 LOV: 11/19/18 Anxiety CSC: 03/06/17 UDS: 03/06/17

## 2019-01-26 ENCOUNTER — Other Ambulatory Visit: Payer: Self-pay | Admitting: Family Medicine

## 2019-01-27 NOTE — Telephone Encounter (Signed)
Last OV 11/19/18 Alprazolam last filled 12/30/18 #60 with 0

## 2019-02-23 ENCOUNTER — Other Ambulatory Visit: Payer: Self-pay | Admitting: Physician Assistant

## 2019-02-24 NOTE — Telephone Encounter (Signed)
Ambien last 11/19/18 #15 1 RF Xanax last 01/27/19 #60 LOV: 11/19/18 Anxiety/Insomnia Next appt 03/06/19

## 2019-03-06 ENCOUNTER — Encounter: Payer: Self-pay | Admitting: Physician Assistant

## 2019-03-06 ENCOUNTER — Other Ambulatory Visit: Payer: Self-pay

## 2019-03-06 ENCOUNTER — Ambulatory Visit (INDEPENDENT_AMBULATORY_CARE_PROVIDER_SITE_OTHER): Payer: 59 | Admitting: Physician Assistant

## 2019-03-06 VITALS — BP 124/82 | HR 103 | Temp 99.3°F | Resp 16 | Ht 66.5 in | Wt 137.0 lb

## 2019-03-06 DIAGNOSIS — F419 Anxiety disorder, unspecified: Secondary | ICD-10-CM

## 2019-03-06 DIAGNOSIS — Z0000 Encounter for general adult medical examination without abnormal findings: Secondary | ICD-10-CM | POA: Diagnosis not present

## 2019-03-06 DIAGNOSIS — F5105 Insomnia due to other mental disorder: Secondary | ICD-10-CM

## 2019-03-06 DIAGNOSIS — F411 Generalized anxiety disorder: Secondary | ICD-10-CM | POA: Diagnosis not present

## 2019-03-06 LAB — CBC WITH DIFFERENTIAL/PLATELET
Basophils Absolute: 0.1 10*3/uL (ref 0.0–0.1)
Basophils Relative: 0.9 % (ref 0.0–3.0)
Eosinophils Absolute: 0.7 10*3/uL (ref 0.0–0.7)
Eosinophils Relative: 9.5 % — ABNORMAL HIGH (ref 0.0–5.0)
HCT: 46.3 % (ref 39.0–52.0)
Hemoglobin: 16.4 g/dL (ref 13.0–17.0)
Lymphocytes Relative: 24.7 % (ref 12.0–46.0)
Lymphs Abs: 1.9 10*3/uL (ref 0.7–4.0)
MCHC: 35.3 g/dL (ref 30.0–36.0)
MCV: 94.8 fl (ref 78.0–100.0)
Monocytes Absolute: 0.7 10*3/uL (ref 0.1–1.0)
Monocytes Relative: 8.8 % (ref 3.0–12.0)
Neutro Abs: 4.4 10*3/uL (ref 1.4–7.7)
Neutrophils Relative %: 56.1 % (ref 43.0–77.0)
Platelets: 284 10*3/uL (ref 150.0–400.0)
RBC: 4.89 Mil/uL (ref 4.22–5.81)
RDW: 12.5 % (ref 11.5–15.5)
WBC: 7.9 10*3/uL (ref 4.0–10.5)

## 2019-03-06 LAB — LIPID PANEL
Cholesterol: 159 mg/dL (ref 0–200)
HDL: 55.2 mg/dL (ref >39.00)
LDL Cholesterol: 88 mg/dL (ref 0–99)
NonHDL: 103.31
Total CHOL/HDL Ratio: 3
Triglycerides: 75 mg/dL (ref 0.0–149.0)
VLDL: 15 mg/dL (ref 0.0–40.0)

## 2019-03-06 LAB — COMPREHENSIVE METABOLIC PANEL
ALT: 22 U/L (ref 0–53)
AST: 18 U/L (ref 0–37)
Albumin: 4.5 g/dL (ref 3.5–5.2)
Alkaline Phosphatase: 79 U/L (ref 39–117)
BUN: 16 mg/dL (ref 6–23)
CO2: 28 mEq/L (ref 19–32)
Calcium: 9.9 mg/dL (ref 8.4–10.5)
Chloride: 105 mEq/L (ref 96–112)
Creatinine, Ser: 0.94 mg/dL (ref 0.40–1.50)
GFR: 87.15 mL/min (ref >60.00)
Glucose, Bld: 93 mg/dL (ref 70–99)
Potassium: 4.2 mEq/L (ref 3.5–5.1)
Sodium: 141 mEq/L (ref 135–145)
Total Bilirubin: 0.5 mg/dL (ref 0.2–1.2)
Total Protein: 7 g/dL (ref 6.0–8.3)

## 2019-03-06 LAB — TSH: TSH: 2.37 u[IU]/mL (ref 0.35–4.50)

## 2019-03-06 NOTE — Patient Instructions (Signed)
Please go to the lab for blood work.   Our office will call you with your results unless you have chosen to receive results via MyChart.  If your blood work is normal we will follow-up each year for physicals and as scheduled for chronic medical problems.  If anything is abnormal we will treat accordingly and get you in for a follow-up.   Preventive Care 40-44 Years Old, Male Preventive care refers to lifestyle choices and visits with your health care provider that can promote health and wellness. This includes:  A yearly physical exam. This is also called an annual well check.  Regular dental and eye exams.  Immunizations.  Screening for certain conditions.  Healthy lifestyle choices, such as eating a healthy diet, getting regular exercise, not using drugs or products that contain nicotine and tobacco, and limiting alcohol use. What can I expect for my preventive care visit? Physical exam Your health care provider will check:  Height and weight. These may be used to calculate body mass index (BMI), which is a measurement that tells if you are at a healthy weight.  Heart rate and blood pressure.  Your skin for abnormal spots. Counseling Your health care provider may ask you questions about:  Alcohol, tobacco, and drug use.  Emotional well-being.  Home and relationship well-being.  Sexual activity.  Eating habits.  Work and work environment. What immunizations do I need?  Influenza (flu) vaccine  This is recommended every year. Tetanus, diphtheria, and pertussis (Tdap) vaccine  You may need a Td booster every 10 years. Varicella (chickenpox) vaccine  You may need this vaccine if you have not already been vaccinated. Zoster (shingles) vaccine  You may need this after age 60. Measles, mumps, and rubella (MMR) vaccine  You may need at least one dose of MMR if you were born in 1957 or later. You may also need a second dose. Pneumococcal conjugate (PCV13)  vaccine  You may need this if you have certain conditions and were not previously vaccinated. Pneumococcal polysaccharide (PPSV23) vaccine  You may need one or two doses if you smoke cigarettes or if you have certain conditions. Meningococcal conjugate (MenACWY) vaccine  You may need this if you have certain conditions. Hepatitis A vaccine  You may need this if you have certain conditions or if you travel or work in places where you may be exposed to hepatitis A. Hepatitis B vaccine  You may need this if you have certain conditions or if you travel or work in places where you may be exposed to hepatitis B. Haemophilus influenzae type b (Hib) vaccine  You may need this if you have certain risk factors. Human papillomavirus (HPV) vaccine  If recommended by your health care provider, you may need three doses over 6 months. You may receive vaccines as individual doses or as more than one vaccine together in one shot (combination vaccines). Talk with your health care provider about the risks and benefits of combination vaccines. What tests do I need? Blood tests  Lipid and cholesterol levels. These may be checked every 5 years, or more frequently if you are over 50 years old.  Hepatitis C test.  Hepatitis B test. Screening  Lung cancer screening. You may have this screening every year starting at age 55 if you have a 30-pack-year history of smoking and currently smoke or have quit within the past 15 years.  Prostate cancer screening. Recommendations will vary depending on your family history and other risks.  Colorectal cancer   screening. All adults should have this screening starting at age 50 and continuing until age 75. Your health care provider may recommend screening at age 45 if you are at increased risk. You will have tests every 1-10 years, depending on your results and the type of screening test.  Diabetes screening. This is done by checking your blood sugar (glucose) after  you have not eaten for a while (fasting). You may have this done every 1-3 years.  Sexually transmitted disease (STD) testing. Follow these instructions at home: Eating and drinking  Eat a diet that includes fresh fruits and vegetables, whole grains, lean protein, and low-fat dairy products.  Take vitamin and mineral supplements as recommended by your health care provider.  Do not drink alcohol if your health care provider tells you not to drink.  If you drink alcohol: ? Limit how much you have to 0-2 drinks a day. ? Be aware of how much alcohol is in your drink. In the [place] one drink equals one 12 oz bottle of beer (355 mL), one 5 oz glass of wine (148 mL), or one 1 oz glass of hard liquor (44 mL). Lifestyle  Take daily care of your teeth and gums.  Stay active. Exercise for at least 30 minutes on 5 or more days each week.  Do not use any products that contain nicotine or tobacco, such as cigarettes, e-cigarettes, and chewing tobacco. If you need help quitting, ask your health care provider.  If you are sexually active, practice safe sex. Use a condom or other form of protection to prevent STIs (sexually transmitted infections).  Talk with your health care provider about taking a low-dose aspirin every day starting at age 50. What's next?  Go to your health care provider once a year for a well check visit.  Ask your health care provider how often you should have your eyes and teeth checked.  Stay up to date on all vaccines. This information is not intended to replace advice given to you by your health care provider. Make sure you discuss any questions you have with your health care provider. Document Revised: 12/13/2017 Document Reviewed: 12/13/2017 Elsevier Patient Education  2020 Elsevier Inc. .      

## 2019-03-06 NOTE — Progress Notes (Signed)
Patient presents to clinic today for annual exam.  Patient is fasting for labs.  Diet -- Trying to keep well-hydrated. Harder on work days. Trying to work on dietary choices.  Exercise -- Keeps active with work.  Acute Concerns: Denies acute concerns today.  Chronic Issues: GAD--patient continues to do very well.  Takes alprazolam as needed for more acute anxiety which happens in certain social situations.  Denies depressed mood or anhedonia.  Denies suicidal thought or ideation.  Manages generalized anxiety symptoms with deep breathing.  Insomnia --patient endorses this remains well controlled on his current regimen.  Sleep is restful.  Denies any side effects of Ambien.  Health Maintenance: Immunizations -- TDaP up-to-date  Past Medical History:  Diagnosis Date  . History of chicken pox   . LBP (low back pain)     Past Surgical History:  Procedure Laterality Date  . TONSILLECTOMY      Current Outpatient Medications on File Prior to Visit  Medication Sig Dispense Refill  . ALPRAZolam (XANAX) 0.25 MG tablet TAKE 1 TABLET BY MOUTH 2 (TWO) TIMES DAILY AS NEEDED. NEED FOLLOW-UP FOR FURTHER REFILLS. 60 tablet 0  . Multiple Vitamins-Minerals (MENS MULTIVITAMIN PLUS) TABS Take by mouth daily.    Marland Kitchen zolpidem (AMBIEN CR) 12.5 MG CR tablet TAKE 1 TABLET BY MOUTH AT BEDTIME AS NEEDED FOR SLEEP 15 tablet 1   No current facility-administered medications on file prior to visit.    Allergies  Allergen Reactions  . Celebrex [Celecoxib] Palpitations    Family History  Problem Relation Age of Onset  . Kidney Stones Mother   . Hepatitis C Father   . Diabetes Father   . Hypertension Father   . Diabetes Maternal Grandmother   . Breast cancer Paternal Grandmother        Metastatic  . Heart disease Paternal Grandfather   . Heart attack Paternal Grandfather     Social History   Socioeconomic History  . Marital status: Single    Spouse name: Not on file  . Number of children:  0  . Years of education: Not on file  . Highest education level: Not on file  Occupational History  . Occupation: [employer]     Comment: Consulting civil engineer  Tobacco Use  . Smoking status: Current Every Day Smoker    Packs/day: 0.25    Years: 25.00    Pack years: 6.25    Types: Cigarettes  . Smokeless tobacco: Never Used  Substance and Sexual Activity  . Alcohol use: Yes    Comment: social -- maybe 1-2 per week  . Drug use: No  . Sexual activity: Not Currently  Other Topics Concern  . Not on file  Social History Narrative  . Not on file   Social Determinants of Health   Financial Resource Strain:   . Difficulty of Paying Living Expenses: Not on file  Food Insecurity:   . Worried About Programme researcher, broadcasting/film/video in the Last Year: Not on file  . Ran Out of Food in the Last Year: Not on file  Transportation Needs:   . Lack of Transportation (Medical): Not on file  . Lack of Transportation (Non-Medical): Not on file  Physical Activity:   . Days of Exercise per Week: Not on file  . Minutes of Exercise per Session: Not on file  Stress:   . Feeling of Stress : Not on file  Social Connections:   . Frequency of Communication with Friends and Family: Not on file  .  Frequency of Social Gatherings with Friends and Family: Not on file  . Attends Religious Services: Not on file  . Active Member of Clubs or Organizations: Not on file  . Attends Banker Meetings: Not on file  . Marital Status: Not on file  Intimate Partner Violence:   . Fear of Current or Ex-Partner: Not on file  . Emotionally Abused: Not on file  . Physically Abused: Not on file  . Sexually Abused: Not on file    Review of Systems  Constitutional: Negative for fever and weight loss.  HENT: Negative for ear discharge, ear pain, hearing loss and tinnitus.   Eyes: Negative for blurred vision, double vision, photophobia and pain.  Respiratory: Negative for cough and shortness of breath.   Cardiovascular:  Negative for chest pain and palpitations.  Gastrointestinal: Negative for abdominal pain, blood in stool, constipation, diarrhea, heartburn, melena, nausea and vomiting.  Genitourinary: Negative for dysuria, flank pain, frequency, hematuria and urgency.  Musculoskeletal: Negative for falls.  Neurological: Negative for dizziness, loss of consciousness and headaches.  Endo/Heme/Allergies: Negative for environmental allergies.  Psychiatric/Behavioral: Negative for depression, hallucinations, substance abuse and suicidal ideas. The patient is not nervous/anxious and does not have insomnia.    BP 124/82   Pulse (!) 103   Temp 99.3 F (37.4 C) (Temporal)   Resp 16   Ht 5' 6.5" (1.689 m)   Wt 137 lb (62.1 kg)   SpO2 99%   BMI 21.78 kg/m   Physical Exam Vitals reviewed.  Constitutional:      General: He is not in acute distress.    Appearance: He is well-developed. He is not diaphoretic.  HENT:     Head: Normocephalic and atraumatic.     Right Ear: Tympanic membrane, ear canal and external ear normal.     Left Ear: Tympanic membrane, ear canal and external ear normal.     Nose: Nose normal.     Mouth/Throat:     Pharynx: No posterior oropharyngeal erythema.  Eyes:     Conjunctiva/sclera: Conjunctivae normal.     Pupils: Pupils are equal, round, and reactive to light.  Neck:     Thyroid: No thyromegaly.  Cardiovascular:     Rate and Rhythm: Normal rate and regular rhythm.     Heart sounds: Normal heart sounds.  Pulmonary:     Effort: Pulmonary effort is normal. No respiratory distress.     Breath sounds: Normal breath sounds. No wheezing or rales.  Chest:     Chest wall: No tenderness.  Abdominal:     General: Bowel sounds are normal. There is no distension.     Palpations: Abdomen is soft. There is no mass.     Tenderness: There is no abdominal tenderness. There is no guarding or rebound.  Musculoskeletal:     Cervical back: Neck supple.  Lymphadenopathy:     Cervical: No  cervical adenopathy.  Skin:    General: Skin is warm and dry.     Findings: No rash.  Neurological:     Mental Status: He is alert and oriented to person, place, and time.     Cranial Nerves: No cranial nerve deficit.    Assessment/Plan: 1. Visit for preventive health examination Depression screen negative. Health Maintenance reviewed --declines flu shot.  Tetanus up-to-date.. Preventive schedule discussed and handout given in AVS. Will obtain fasting labs today. - CBC with Differential/Platelet - Comprehensive metabolic panel - Lipid panel - TSH  2. Insomnia secondary to anxiety Doing  very well.  Continue current regimen.  Medications refilled.  3. GAD (generalized anxiety disorder) Continue supportive measures.  Will reassess TSH level today.  Alprazolam as directed when needed.  CSA updated today.  PDMP reviewed.  No red flags. - TSH   This visit occurred during the SARS-CoV-2 public health emergency.  Safety protocols were in place, including screening questions prior to the visit, additional usage of staff PPE, and extensive cleaning of exam room while observing appropriate contact time as indicated for disinfecting solutions.      Leeanne Rio, PA-C

## 2019-03-25 ENCOUNTER — Other Ambulatory Visit: Payer: Self-pay | Admitting: Physician Assistant

## 2019-03-25 NOTE — Telephone Encounter (Signed)
Xanax last rx 02/24/19 #15 1 RF LOV: 03/06/19 CPE  CSC: 03/06/19

## 2019-04-21 ENCOUNTER — Other Ambulatory Visit: Payer: Self-pay | Admitting: Physician Assistant

## 2019-04-21 NOTE — Telephone Encounter (Signed)
Xanax last rx 03/25/19 #60 Ambien last rx 02/24/19 #15 1 RF LOV: 03/06/19 CPE CSC: 03/06/19 Xanax

## 2019-05-18 ENCOUNTER — Other Ambulatory Visit: Payer: Self-pay | Admitting: Physician Assistant

## 2019-05-19 NOTE — Telephone Encounter (Signed)
Alprazolam LFD - 04/21/19 #60 with 0 refills LOV -  03/06/19 NOV - none

## 2019-06-16 ENCOUNTER — Other Ambulatory Visit: Payer: Self-pay | Admitting: Physician Assistant

## 2019-07-14 ENCOUNTER — Other Ambulatory Visit: Payer: Self-pay | Admitting: Physician Assistant

## 2019-07-15 NOTE — Telephone Encounter (Signed)
Alprazolam LFD 06/17/19 #60 with no refills LOV 03/06/19 NOV none

## 2019-07-28 ENCOUNTER — Other Ambulatory Visit: Payer: Self-pay | Admitting: Physician Assistant

## 2019-07-29 NOTE — Telephone Encounter (Signed)
Alprazolam LFD 07/16/19 #60 LOV 03/06/19 NOV none

## 2019-08-12 ENCOUNTER — Other Ambulatory Visit: Payer: Self-pay | Admitting: Physician Assistant

## 2019-08-12 NOTE — Telephone Encounter (Signed)
Zolpidem LFD 06/17/19 #15 with 1 refill LOV 03/06/19 NOV none

## 2019-08-15 ENCOUNTER — Other Ambulatory Visit: Payer: Self-pay | Admitting: Physician Assistant

## 2019-08-15 NOTE — Telephone Encounter (Signed)
Alprazolam LFD 07/16/19 #60 with no refills LOV 03/06/19 NOV none

## 2019-09-14 ENCOUNTER — Other Ambulatory Visit: Payer: Self-pay | Admitting: Physician Assistant

## 2019-09-15 ENCOUNTER — Other Ambulatory Visit: Payer: Self-pay | Admitting: Physician Assistant

## 2019-09-15 NOTE — Telephone Encounter (Signed)
Xanax last rx 08/15/19 #60 LOV: 34/21 CPE CSC: 03/06/19 No upcoming appointment

## 2019-09-16 NOTE — Telephone Encounter (Signed)
duplicate

## 2019-09-16 NOTE — Telephone Encounter (Signed)
Pt called in asking for a update on the refill request for the alprazolam. States that he has sent over a few requests asking for refills. Please advise

## 2019-09-16 NOTE — Telephone Encounter (Signed)
Last OV 03/06/19 Alprazolam last filled 08/15/19 #60 with 0

## 2019-09-17 ENCOUNTER — Other Ambulatory Visit: Payer: Self-pay

## 2019-09-17 ENCOUNTER — Telehealth (INDEPENDENT_AMBULATORY_CARE_PROVIDER_SITE_OTHER): Payer: No Typology Code available for payment source | Admitting: Physician Assistant

## 2019-09-17 ENCOUNTER — Encounter: Payer: Self-pay | Admitting: Physician Assistant

## 2019-09-17 DIAGNOSIS — F411 Generalized anxiety disorder: Secondary | ICD-10-CM | POA: Diagnosis not present

## 2019-09-17 NOTE — Progress Notes (Signed)
I have discussed the procedure for the virtual visit with the patient who has given consent to proceed with assessment and treatment.   Vernon Sellers, CMA     

## 2019-09-18 MED ORDER — QUETIAPINE FUMARATE 25 MG PO TABS
25.0000 mg | ORAL_TABLET | Freq: Every day | ORAL | 1 refills | Status: DC
Start: 2019-09-18 — End: 2019-11-18

## 2019-09-18 MED ORDER — ALPRAZOLAM 0.5 MG PO TABS: 0.2500 mg | ORAL_TABLET | Freq: Two times a day (BID) | ORAL | 0 refills | Status: DC | PRN

## 2019-09-18 NOTE — Progress Notes (Signed)
° °  Virtual Visit via Video   I connected with patient on 09/18/19 at  4:00 PM EDT by a video enabled telemedicine application and verified that I am speaking with the correct person using two identifiers.  Location patient: Home Location provider: [facility], [facility] Persons participating in the virtual visit: Patient, Provider, CMA (Patina Moore)  I discussed the limitations of evaluation and management by telemedicine and the availability of in person appointments. The patient expressed understanding and agreed to proceed.  Subjective:   HPI:   Patient presents via Caregility today to follow-up regarding anxiety state. Patient currently on a regimen of alprazolam 0.25 mg BID PRN for acute anxiety. Has been controlled on this regimen previously but over the past few months having significant increase in generalized anxiety and increase in episodes of acute anxiety despite medication. Contributes this to increase in work and financial stressors. Is impacting sleep despite medication. Has previously been tried on multiple SSRI, SNRI and Buspirone with poor tolerability. Is interested in other options to help control his symptoms. He denies depressed mood or anhedonia. Denies SI/HI.   ROS:   See pertinent positives and negatives per HPI.  Patient Active Problem List   Diagnosis Date Noted   GAD (generalized anxiety disorder) 03/04/2018   Visit for preventive health examination 09/06/2016   Insomnia secondary to anxiety 08/08/2016    Social History   Tobacco Use   Smoking status: Current Every Day Smoker    Packs/day: 0.25    Years: 25.00    Pack years: 6.25    Types: Cigarettes   Smokeless tobacco: Never Used  Substance Use Topics   Alcohol use: Yes    Comment: social -- maybe 1-2 per week    Current Outpatient Medications:    ALPRAZolam (XANAX) 0.25 MG tablet, TAKE 1 TABLET (0.25 MG TOTAL) BY MOUTH 2 (TWO) TIMES DAILY AS NEEDED FOR ANXIETY., Disp: 60 tablet,  Rfl: 0   Multiple Vitamins-Minerals (MENS MULTIVITAMIN PLUS) TABS, Take by mouth daily., Disp: , Rfl:    zolpidem (AMBIEN CR) 12.5 MG CR tablet, TAKE 1 TABLET BY MOUTH EVERY DAY AT BEDTIME AS NEEDED FOR SLEEP, Disp: 15 tablet, Rfl: 1  Allergies  Allergen Reactions   Celebrex [Celecoxib] Palpitations    Objective:   There were no vitals taken for this visit.  Patient is well-developed, well-nourished in no acute distress.  Resting comfortably at home.  Head is normocephalic, atraumatic.  No labored breathing.  Speech is clear and coherent with logical content.  Patient is alert and oriented at baseline.   Assessment and Plan:   1. GAD (generalized anxiety disorder) Patient with history of poor tolerability of traditional antidepressants and anxiolytics. Will start trial of Seroquel 25 mg nightly, increasing based on tolerability and response over the next couple of months. Will increase Alprazolam dose to 0.5 mg tablet -- take 1/2 to 1 tablet BID PRN for acute anxiety. Follow-up 3-4 weeks.     Piedad Climes, PA-C 09/18/2019

## 2019-10-16 ENCOUNTER — Other Ambulatory Visit: Payer: Self-pay | Admitting: Physician Assistant

## 2019-10-16 NOTE — Telephone Encounter (Signed)
Xanax last rx 09/18/19 #60 LOV: 09/17/19 Anxiety CSC: 03/06/19 UDS:  03/06/17

## 2019-10-17 MED ORDER — ALPRAZOLAM 0.5 MG PO TABS: ORAL_TABLET | ORAL | 0 refills | Status: DC

## 2019-10-17 NOTE — Addendum Note (Signed)
Addended by: Waldon Merl on: 10/17/2019 10:15 PM   Modules accepted: Orders

## 2019-11-09 ENCOUNTER — Other Ambulatory Visit: Payer: Self-pay | Admitting: Physician Assistant

## 2019-11-10 NOTE — Telephone Encounter (Signed)
LFD 09/18/19 #30 with 1 refill LOV 09/17/19 NOV none

## 2019-11-12 ENCOUNTER — Other Ambulatory Visit: Payer: Self-pay | Admitting: Physician Assistant

## 2019-11-12 ENCOUNTER — Encounter: Payer: Self-pay | Admitting: Emergency Medicine

## 2019-11-16 ENCOUNTER — Other Ambulatory Visit: Payer: Self-pay | Admitting: Physician Assistant

## 2019-11-17 NOTE — Telephone Encounter (Signed)
Xanax last rx 10/17/19 #60 LOV: 09/17/19 Anxiety CSC: 03/06/19

## 2019-11-18 ENCOUNTER — Telehealth (INDEPENDENT_AMBULATORY_CARE_PROVIDER_SITE_OTHER): Payer: No Typology Code available for payment source | Admitting: Physician Assistant

## 2019-11-18 ENCOUNTER — Encounter: Payer: Self-pay | Admitting: Physician Assistant

## 2019-11-18 ENCOUNTER — Other Ambulatory Visit: Payer: Self-pay

## 2019-11-18 DIAGNOSIS — F5105 Insomnia due to other mental disorder: Secondary | ICD-10-CM | POA: Diagnosis not present

## 2019-11-18 DIAGNOSIS — F419 Anxiety disorder, unspecified: Secondary | ICD-10-CM

## 2019-11-18 MED ORDER — ZOLPIDEM TARTRATE ER 12.5 MG PO TBCR: EXTENDED_RELEASE_TABLET | ORAL | 1 refills | Status: DC

## 2019-11-18 NOTE — Progress Notes (Signed)
I have discussed the procedure for the virtual visit with the patient who has given consent to proceed with assessment and treatment.   Odile Veloso S Kaheem Halleck, CMA     

## 2019-11-18 NOTE — Progress Notes (Signed)
° °  Virtual Visit via Video   I connected with patient on 11/18/19 at  3:30 PM EST by a video enabled telemedicine application and verified that I am speaking with the correct person using two identifiers.  Location patient: Home Location provider: [facility], Office Persons participating in the virtual visit: Patient, Provider, CMA (Patina Moore)  I discussed the limitations of evaluation and management by telemedicine and the availability of in person appointments. The patient expressed understanding and agreed to proceed.  Subjective:   HPI:   Patient presents via Caregility today to follow-up regarding insomnia.  At last visit patient was switched to a regimen of Seroquel 25 mg nightly in hopes of being able to get sleep under control without Ambien.  Patient states he took as directed and tolerated well except for excessive daytime somnolence the next day.  Notes he was very groggy and was very hard to get his day started.  Did have an impact on work productivity.  As such he stopped the Seroquel.  ROS:   See pertinent positives and negatives per HPI.  Patient Active Problem List   Diagnosis Date Noted   GAD (generalized anxiety disorder) 03/04/2018   Visit for preventive health examination 09/06/2016   Insomnia secondary to anxiety 08/08/2016    Social History   Tobacco Use   Smoking status: Current Every Day Smoker    Packs/day: 0.25    Years: 25.00    Pack years: 6.25    Types: Cigarettes   Smokeless tobacco: Never Used  Substance Use Topics   Alcohol use: Yes    Comment: social -- maybe 1-2 per week    Current Outpatient Medications:    ALPRAZolam (XANAX) 0.5 MG tablet, TAKE 1/2 TO 1 TABLET BY MOUTH TWICE A DAY AS NEEDED FOR ANXIETY, Disp: 60 tablet, Rfl: 0   Multiple Vitamins-Minerals (MENS MULTIVITAMIN PLUS) TABS, Take by mouth daily., Disp: , Rfl:    QUEtiapine (SEROQUEL) 25 MG tablet, Take 1 tablet (25 mg total) by mouth at bedtime. (Patient  not taking: Reported on 11/18/2019), Disp: 30 tablet, Rfl: 1   zolpidem (AMBIEN CR) 12.5 MG CR tablet, TAKE 1 TABLET BY MOUTH EVERY DAY AT BEDTIME AS NEEDED FOR SLEEP (Patient not taking: Reported on 11/18/2019), Disp: 15 tablet, Rfl: 1  Allergies  Allergen Reactions   Celebrex [Celecoxib] Palpitations    Objective:   There were no vitals taken for this visit.  Patient is well-developed, well-nourished in no acute distress.  Resting comfortably at home.  Head is normocephalic, atraumatic.  No labored breathing.  Speech is clear and coherent with logical content.  Patient is alert and oriented at baseline.   Assessment and Plan:   1. Insomnia secondary to anxiety Cannot tolerate low-dose of Seroquel.  Has been on other medications in the past with side effect or subtherapeutic affect.  Will restart Ambien CR 12.5 mg nightly.  Follow-up 6 months for CPE.  Sooner if needed. - zolpidem (AMBIEN CR) 12.5 MG CR tablet; TAKE 1 TABLET BY MOUTH EVERY DAY AT BEDTIME AS NEEDED FOR SLEEP  Dispense: 30 tablet; Refill: 1 .   Piedad Climes, PA-C 11/18/2019

## 2019-12-15 ENCOUNTER — Other Ambulatory Visit: Payer: Self-pay | Admitting: Physician Assistant

## 2019-12-16 NOTE — Telephone Encounter (Signed)
LFD 11/17/19 #60 with no refills LOV 11/18/19 NOV none

## 2020-01-19 ENCOUNTER — Other Ambulatory Visit: Payer: Self-pay | Admitting: Physician Assistant

## 2020-01-19 NOTE — Telephone Encounter (Signed)
Xanax last rx 12/18/19 #60 LOV: 11/18/19 Anxiety CSC: 03/06/19

## 2020-01-29 ENCOUNTER — Encounter: Payer: Self-pay | Admitting: Physician Assistant

## 2020-01-29 DIAGNOSIS — F5105 Insomnia due to other mental disorder: Secondary | ICD-10-CM

## 2020-01-29 DIAGNOSIS — F419 Anxiety disorder, unspecified: Secondary | ICD-10-CM

## 2020-01-30 MED ORDER — ZOLPIDEM TARTRATE ER 12.5 MG PO TBCR: EXTENDED_RELEASE_TABLET | ORAL | 0 refills | Status: DC

## 2020-01-30 MED ORDER — ALPRAZOLAM 0.5 MG PO TABS: ORAL_TABLET | ORAL | 0 refills | Status: DC

## 2020-03-15 ENCOUNTER — Telehealth: Payer: Self-pay | Admitting: Physician Assistant

## 2020-03-15 NOTE — Telephone Encounter (Signed)
..  Medication Refills  Last OV:  Medication:  Alprazolam  Pharmacy:  [facility] on [place], [place]  Let patient know to contact pharmacy at the end of the day to make sure medication is ready.   Please notify patient to allow 48-72 hours to process.  Encourage patient to contact the pharmacy for refills or they can request refills through [facility]  Clinical Fills out below:   Last refill:  QTY:  Refill Date:    Other Comments:   Okay for refill?  Please advise.

## 2020-03-15 NOTE — Telephone Encounter (Signed)
Requesting:Xanax 0.5mg  Contract: UDS: Last Visit:11/18/19 Next Visit:n/a Last Refill:01/10/20 60 tabs 0 refills  Please Advise

## 2020-03-17 MED ORDER — ALPRAZOLAM 0.5 MG PO TABS: ORAL_TABLET | ORAL | 0 refills | Status: DC

## 2020-04-08 ENCOUNTER — Ambulatory Visit (INDEPENDENT_AMBULATORY_CARE_PROVIDER_SITE_OTHER): Payer: No Typology Code available for payment source | Admitting: Family Medicine

## 2020-04-08 ENCOUNTER — Other Ambulatory Visit: Payer: Self-pay

## 2020-04-08 ENCOUNTER — Encounter: Payer: Self-pay | Admitting: Family Medicine

## 2020-04-08 DIAGNOSIS — F5105 Insomnia due to other mental disorder: Secondary | ICD-10-CM | POA: Diagnosis not present

## 2020-04-08 DIAGNOSIS — F419 Anxiety disorder, unspecified: Secondary | ICD-10-CM | POA: Diagnosis not present

## 2020-04-08 DIAGNOSIS — F411 Generalized anxiety disorder: Secondary | ICD-10-CM

## 2020-04-08 DIAGNOSIS — K219 Gastro-esophageal reflux disease without esophagitis: Secondary | ICD-10-CM | POA: Diagnosis not present

## 2020-04-08 DIAGNOSIS — F410 Panic disorder [episodic paroxysmal anxiety] without agoraphobia: Secondary | ICD-10-CM

## 2020-04-08 MED ORDER — ESCITALOPRAM OXALATE 10 MG PO TABS: ORAL_TABLET | ORAL | 3 refills | Status: DC

## 2020-04-08 MED ORDER — ALPRAZOLAM 0.5 MG PO TABS
ORAL_TABLET | ORAL | 1 refills | Status: DC
Start: 2020-04-08 — End: 2020-06-22

## 2020-04-08 MED ORDER — ZOLPIDEM TARTRATE ER 12.5 MG PO TBCR: EXTENDED_RELEASE_TABLET | ORAL | 1 refills | Status: DC

## 2020-04-08 NOTE — Progress Notes (Signed)
Vernon Sellers - 45 y.o. male MRN [id number]  Date of birth: 1975/07/03  Subjective Chief Complaint  Patient presents with  . Establish Care  . Medication Refill    HPI Vernon Sellers is a 45 y.o. male here today for initial visit to establish care.  He has history of insomnia and panic disorder.  Previous notes from his prior PCP were reviewed in EMR.  He has chronic insomnia that has been well managed with Ambien CR 12.5 mg nightly.  This continues to work very well for him.  He has tried other sleep aids in the past including trazodone which was ineffective, as well as Seroquel which made him too drowsy the following day.  He has not noted any significant side effects from Ambien CR.  He has been taking chronic alprazolam as needed for your anxiety and panic disorder.  He has tried SSRIs in the past including fluoxetine and sertraline.  He is somewhat hesitant about starting another SSRI due to previous medications being ineffective.    Currently taking over-the-counter omeprazole for GERD symptoms.  He reports some breakthrough reflux symptoms over the past few weeks.  ROS:  A comprehensive ROS was completed and negative except as noted per HPI  Allergies  Allergen Reactions  . Celebrex [Celecoxib] Palpitations    Past Medical History:  Diagnosis Date  . Anxiety   . Asthma   . History of chicken pox   . LBP (low back pain)     Past Surgical History:  Procedure Laterality Date  . TONSILLECTOMY      Social History   Socioeconomic History  . Marital status: Single    Spouse name: Not on file  . Number of children: 0  . Years of education: Not on file  . Highest education level: Not on file  Occupational History  . Occupation: [employer]     Comment: Consulting civil engineer  Tobacco Use  . Smoking status: Current Every Day Smoker    Packs/day: 1.00    Years: 30.00    Pack years: 30.00    Types: Cigarettes    Start date: 01/02/1990  . Smokeless tobacco: Never Used   Vaping Use  . Vaping Use: Former  Substance and Sexual Activity  . Alcohol use: Yes    Alcohol/week: 3.0 standard drinks    Types: 3 Standard drinks or equivalent per week  . Drug use: No  . Sexual activity: Not Currently    Partners: Female  Other Topics Concern  . Not on file  Social History Narrative  . Not on file   Social Determinants of Health   Financial Resource Strain: Not on file  Food Insecurity: Not on file  Transportation Needs: Not on file  Physical Activity: Not on file  Stress: Not on file  Social Connections: Not on file    Family History  Problem Relation Age of Onset  . Kidney Stones Mother   . Hepatitis C Father   . Diabetes Father   . Hypertension Father   . Diabetes Maternal Grandmother   . Breast cancer Paternal Grandmother        Metastatic  . Heart disease Paternal Grandfather   . Heart attack Paternal Grandfather   . Heart failure Paternal Grandfather   . Heart attack Maternal Uncle   . Prostate cancer Maternal Uncle     Health Maintenance  Topic Date Due  . Hepatitis C Screening  Never done  . HIV Screening  Never done  . COVID-19 Vaccine (1)  09/02/2020 (Originally 02/09/1980)  . COLONOSCOPY (Pts 45-56yrs Insurance coverage will need to be confirmed)  04/08/2021 (Originally 02/09/2020)  . INFLUENZA VACCINE  08/02/2020  . TETANUS/TDAP  03/03/2028  . HPV VACCINES  Aged Out     ----------------------------------------------------------------------------------------------------------------------------------------------------------------------------------------------------------------- Physical Exam BP 132/83 (BP Location: Left Arm, Patient Position: Sitting, Cuff Size: Normal)   Pulse 91   Temp 98 F (36.7 C) (Oral)   Wt 142 lb (64.4 kg)   SpO2 100%   BMI 22.58 kg/m   Physical Exam Constitutional:      Appearance: Normal appearance.  HENT:     Head: Normocephalic and atraumatic.  Eyes:     General: No scleral  icterus. Cardiovascular:     Rate and Rhythm: Normal rate and regular rhythm.  Pulmonary:     Effort: Pulmonary effort is normal.     Breath sounds: Normal breath sounds.  Musculoskeletal:     Cervical back: Neck supple.  Skin:    General: Skin is warm and dry.  Neurological:     General: No focal deficit present.     Mental Status: He is alert.  Psychiatric:        Mood and Affect: Mood normal.        Behavior: Behavior normal.     ------------------------------------------------------------------------------------------------------------------------------------------------------------------------------------------------------------------- Assessment and Plan  Generalized anxiety disorder with panic attacks Currently only using alprazolam as needed for anxiety and panic.  Recommended adding on Lexapro to help with anxiety in an effort to reduce his reliance on alprazolam.  Insomnia secondary to anxiety He is doing well with Ambien CR, will continue this at current strength.  GERD (gastroesophageal reflux disease) He will try increasing omeprazole to twice per day over the next couple of weeks.   Meds ordered this encounter  Medications  . zolpidem (AMBIEN CR) 12.5 MG CR tablet    Sig: TAKE 1 TABLET BY MOUTH EVERY DAY AT BEDTIME AS NEEDED FOR SLEEP    Dispense:  30 tablet    Refill:  1    Refill to be placed on file for patient.  . ALPRAZolam (XANAX) 0.5 MG tablet    Sig: TAKE 1/2 TO 1 TABLET BY MOUTH TWICE A DAY AS NEEDED FOR ANXIETY    Dispense:  60 tablet    Refill:  1    Refill to be placed on file for patient when due.  . escitalopram (LEXAPRO) 10 MG tablet    Sig: Start 5mg  x1 week then increase to 10mg  daily.    Dispense:  30 tablet    Refill:  3    Return in about 6 weeks (around 05/20/2020) for GAD.    This visit occurred during the SARS-CoV-2 public health emergency.  Safety protocols were in place, including screening questions prior to the visit,  additional usage of staff PPE, and extensive cleaning of exam room while observing appropriate contact time as indicated for disinfecting solutions.

## 2020-04-08 NOTE — Assessment & Plan Note (Signed)
He is doing well with Ambien CR, will continue this at current strength.

## 2020-04-08 NOTE — Assessment & Plan Note (Addendum)
Currently only using alprazolam as needed for anxiety and panic.  Recommended adding on Lexapro to help with anxiety in an effort to reduce his reliance on alprazolam.  He does also have EAP counseling available through his employer and plans to give this a try.

## 2020-04-08 NOTE — Patient Instructions (Signed)
Try lexapro (escitalopram) daily. Your anxiety may worsen for a short period of time before it improves.  This is not uncommon, be patient as this does usually improve. I would like to follow up with you in about 6 weeks.    [email or web address]  Panic Attack A panic attack is a sudden episode of severe anxiety, fear, or discomfort that causes physical and emotional symptoms. The attack may be in response to something frightening, or it may occur for no known reason. Symptoms of a panic attack can be similar to symptoms of a heart attack or stroke. It is important to see your health care provider when you have a panic attack so that these conditions can be ruled out. A panic attack is a symptom of another condition. Most panic attacks go away with treatment of the underlying problem. If you have panic attacks often, you may have a condition called panic disorder. What are the causes? A panic attack may be caused by:  An extreme, life-threatening situation, such as a war or natural disaster.  An anxiety disorder, such as post-traumatic stress disorder.  Depression.  Certain medical conditions, including heart problems, neurological conditions, and infections.  Certain over-the-counter and prescription medicines.  Illegal drugs that increase heart rate and blood pressure, such as methamphetamine.  Alcohol.  Supplements that increase anxiety.  Panic disorder. What increases the risk? You are more likely to develop this condition if:  You have an anxiety disorder.  You have another mental health condition.  You take certain medicines.  You use alcohol, illegal drugs, or other substances.  You are under extreme stress.  A life event is causing increased feelings of anxiety and depression. What are the signs or symptoms? A panic attack starts suddenly, usually lasts about 20 minutes, and occurs with one or more of the  following:  A pounding heart.  A feeling that your heart is beating irregularly or faster than normal (palpitations).  Sweating.  Trembling or shaking.  Shortness of breath or feeling smothered.  Feeling choked.  Chest pain or discomfort.  Nausea or a strange feeling in your stomach.  Dizziness, feeling lightheaded, or feeling like you might faint.  Chills or hot flashes.  Numbness or tingling in your lips, hands, or feet.  Feeling confused, or feeling that you are not yourself.  Fear of losing control or being emotionally unstable.  Fear of dying. How is this diagnosed? A panic attack is diagnosed with an assessment by your health care provider. During the assessment your health care provider will ask questions about:  Your history of anxiety, depression, and panic attacks.  Your medical history.  Whether you drink alcohol, use illegal drugs, take supplements, or take medicines. Be honest about your substance use. Your health care provider may also:  Order blood tests or other kinds of tests to rule out serious medical conditions.  Refer you to a mental health professional for further evaluation.   How is this treated? Treatment depends on the cause of the panic attack:  If the cause is a medical problem, your health care provider will either treat that problem or refer you to a specialist.  If the cause is emotional, you may be given anti-anxiety medicines or referred to a counselor. These medicines may reduce how often attacks happen, reduce how severe the attacks are, and lower anxiety.  If the cause is a medicine, your health care provider may tell you to stop the medicine, change your dose, or take a different  medicine.  If the cause is a drug, treatment may involve letting the drug wear off and taking medicine to help the drug leave your body or to counteract its effects. Attacks caused by drug abuse may continue even if you stop using the drug. Follow these  instructions at home:  Take over-the-counter and prescription medicines only as told by your health care provider.  If you feel anxious, limit your caffeine intake.  Take good care of your physical and mental health by: ? Eating a balanced diet that includes plenty of fresh fruits and vegetables, whole grains, lean meats, and low-fat dairy. ? Getting plenty of rest. Try to get 7-8 hours of uninterrupted sleep each night. ? Exercising regularly. Try to get 30 minutes of physical activity at least 5 days a week. ? Not smoking. Talk to your health care provider if you need help quitting. ? Limiting alcohol intake to no more than 1 drink a day for nonpregnant women and 2 drinks a day for men. One drink equals 12 oz of beer, 5 oz of wine, or 1 oz of hard liquor.  Keep all follow-up visits as told by your health care provider. This is important. Panic attacks may have underlying physical or emotional problems that take time to accurately diagnose. Contact a health care provider if:  Your symptoms do not improve, or they get worse.  You are not able to take your medicine as prescribed because of side effects. Get help right away if:  You have serious thoughts about hurting yourself or others.  You have symptoms of a panic attack. Do not drive yourself to the hospital. Have someone else drive you or call an ambulance. If you ever feel like you may hurt yourself or others, or you have thoughts about taking your own life, get help right away. You can go to your nearest emergency department or call:  Your local emergency services (911 in the [place]  A suicide crisis helpline, such as the National Suicide Prevention Lifeline at (413)722-3412. This is open 24 hours a day. Summary  A panic attack is a sign of a serious health or mental health condition. Get help right away. Do not drive yourself to the hospital. Have someone else drive you or call an ambulance.  Always see a health care provider  to have the reasons for the panic attack correctly diagnosed.  If your panic attack was caused by a physical problem, follow your health care provider's suggestions for medicine, referral to a specialist, and lifestyle changes.  If your panic attack was caused by an emotional problem, follow through with counseling from a qualified mental health specialist.  If you feel like you may hurt yourself or others, call 911 and get help right away. This information is not intended to replace advice given to you by your health care provider. Make sure you discuss any questions you have with your health care provider. Document Revised: 06/19/2019 Document Reviewed: 06/19/2019 Elsevier Patient Education  2021 [employer].

## 2020-04-08 NOTE — Assessment & Plan Note (Signed)
He will try increasing omeprazole to twice per day over the next couple of weeks.

## 2020-05-31 ENCOUNTER — Other Ambulatory Visit: Payer: Self-pay | Admitting: Family Medicine

## 2020-06-01 ENCOUNTER — Ambulatory Visit: Payer: No Typology Code available for payment source | Admitting: Family Medicine

## 2020-06-08 ENCOUNTER — Other Ambulatory Visit: Payer: Self-pay

## 2020-06-08 ENCOUNTER — Encounter: Payer: Self-pay | Admitting: Family Medicine

## 2020-06-08 ENCOUNTER — Ambulatory Visit (INDEPENDENT_AMBULATORY_CARE_PROVIDER_SITE_OTHER): Payer: No Typology Code available for payment source | Admitting: Family Medicine

## 2020-06-08 DIAGNOSIS — F410 Panic disorder [episodic paroxysmal anxiety] without agoraphobia: Secondary | ICD-10-CM

## 2020-06-08 DIAGNOSIS — F411 Generalized anxiety disorder: Secondary | ICD-10-CM | POA: Diagnosis not present

## 2020-06-08 MED ORDER — SERTRALINE HCL 50 MG PO TABS: 50.0000 mg | ORAL_TABLET | Freq: Every day | ORAL | 3 refills | Status: DC

## 2020-06-08 NOTE — Patient Instructions (Signed)
Let's try adding sertraline.  Start 25mg  daily then increase to 50mg  after 1 week.  See me again in 6 weeks.
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
## 2020-06-08 NOTE — Assessment & Plan Note (Signed)
Continued anxiety.  Still think he would benefit from SSRI, will try sertraline to replace lexapro.  Continue alprazolam as needed.  Discussed referral to psychiatry if not seeing improvement with sertraline.

## 2020-06-18 ENCOUNTER — Other Ambulatory Visit: Payer: Self-pay | Admitting: Family Medicine

## 2020-06-22 ENCOUNTER — Encounter: Payer: Self-pay | Admitting: Family Medicine

## 2020-07-21 ENCOUNTER — Encounter: Payer: Self-pay | Admitting: Family Medicine

## 2020-07-21 ENCOUNTER — Ambulatory Visit (INDEPENDENT_AMBULATORY_CARE_PROVIDER_SITE_OTHER): Payer: No Typology Code available for payment source | Admitting: Family Medicine

## 2020-07-21 ENCOUNTER — Other Ambulatory Visit: Payer: Self-pay

## 2020-07-21 ENCOUNTER — Other Ambulatory Visit: Payer: Self-pay | Admitting: Family Medicine

## 2020-07-21 DIAGNOSIS — F5105 Insomnia due to other mental disorder: Secondary | ICD-10-CM

## 2020-07-21 DIAGNOSIS — F410 Panic disorder [episodic paroxysmal anxiety] without agoraphobia: Secondary | ICD-10-CM | POA: Diagnosis not present

## 2020-07-21 DIAGNOSIS — F411 Generalized anxiety disorder: Secondary | ICD-10-CM | POA: Diagnosis not present

## 2020-07-21 DIAGNOSIS — F419 Anxiety disorder, unspecified: Secondary | ICD-10-CM | POA: Diagnosis not present

## 2020-07-21 MED ORDER — VORTIOXETINE HBR 10 MG PO TABS: 10.0000 mg | ORAL_TABLET | Freq: Every day | ORAL | 3 refills | Status: DC

## 2020-07-21 NOTE — Patient Instructions (Signed)
Let's try trintellix daily to help with anxiety.  See me again in 3 months.

## 2020-07-22 ENCOUNTER — Encounter: Payer: Self-pay | Admitting: Family Medicine

## 2020-07-22 NOTE — Assessment & Plan Note (Signed)
He may continue Ambien CR as needed for insomnia.

## 2020-07-22 NOTE — Assessment & Plan Note (Addendum)
We discussed alternatives to traditional SSRI and will try Trintellix.  Alternatively we could try Viibryd as well if Trintellix is not covered.  We discussed that the goal would be to reduce his alprazolam use.

## 2020-07-22 NOTE — Progress Notes (Signed)
Vernon Sellers - 45 y.o. male MRN [id number]  Date of birth: 03-04-1975  Subjective Chief Complaint  Patient presents with   Anxiety    HPI   Pain Vernon Sellers is a 45 year old male here today for follow-up of generalized anxiety with panic as well as insomnia related to this.  He has tried multiple medications to help with day-to-day anxiety including most recently Lexapro and sertraline.  He has not tolerated these very well and has not really had improvement of his symptoms with taking these medications however he did not really take it for prolonged period of time.  He does continue to use alprazolam as needed for anxiety.  He is also using Ambien CR nightly as needed.  He has not had any side effects related to these medications including oversedation.  ROS:  A comprehensive ROS was completed and negative except as noted per HPI  Allergies  Allergen Reactions   Celebrex [Celecoxib] Palpitations    Past Medical History:  Diagnosis Date   Anxiety    Asthma    History of chicken pox    LBP (low back pain)     Past Surgical History:  Procedure Laterality Date   TONSILLECTOMY      Social History   Socioeconomic History   Marital status: Single    Spouse name: Not on file   Number of children: 0   Years of education: Not on file   Highest education level: Not on file  Occupational History   Occupation: [employer]     Comment: Consulting civil engineer  Tobacco Use   Smoking status: Every Day    Packs/day: 1.00    Years: 30.00    Pack years: 30.00    Types: Cigarettes    Start date: 01/02/1990   Smokeless tobacco: Never  Vaping Use   Vaping Use: Former  Substance and Sexual Activity   Alcohol use: Yes    Alcohol/week: 3.0 standard drinks    Types: 3 Standard drinks or equivalent per week   Drug use: No   Sexual activity: Not Currently    Partners: Female  Other Topics Concern   Not on file  Social History Narrative   Not on file   Social Determinants of Health    Financial Resource Strain: Not on file  Food Insecurity: Not on file  Transportation Needs: Not on file  Physical Activity: Not on file  Stress: Not on file  Social Connections: Not on file    Family History  Problem Relation Age of Onset   Kidney Stones Mother    Hepatitis C Father    Diabetes Father    Hypertension Father    Diabetes Maternal Grandmother    Breast cancer Paternal Grandmother        Metastatic   Heart disease Paternal Grandfather    Heart attack Paternal Grandfather    Heart failure Paternal Grandfather    Heart attack Maternal Uncle    Prostate cancer Maternal Uncle     Health Maintenance  Topic Date Due   Pneumococcal Vaccine 27-78 Years old (1 - PCV) Never done   HIV Screening  Never done   Hepatitis C Screening  Never done   COVID-19 Vaccine (1) 09/02/2020 (Originally 02/09/1980)   COLONOSCOPY (Pts 45-55yrs Insurance coverage will need to be confirmed)  04/08/2021 (Originally 02/09/2020)   INFLUENZA VACCINE  08/02/2020   TETANUS/TDAP  03/03/2028   HPV VACCINES  Aged Out     ----------------------------------------------------------------------------------------------------------------------------------------------------------------------------------------------------------------- Physical Exam BP 134/86 (BP Location: Left Arm,  Patient Position: Sitting, Cuff Size: Small)   Pulse (!) 101   Temp 98 F (36.7 C)   Wt 145 lb (65.8 kg)   SpO2 98%   BMI 23.05 kg/m   Physical Exam Constitutional:      Appearance: Normal appearance.  HENT:     Head: Normocephalic and atraumatic.  Cardiovascular:     Rate and Rhythm: Normal rate and regular rhythm.  Pulmonary:     Effort: Pulmonary effort is normal.     Breath sounds: Normal breath sounds.  Musculoskeletal:     Cervical back: Neck supple.  Neurological:     General: No focal deficit present.     Mental Status: He is alert.  Psychiatric:        Mood and Affect: Mood normal.        Behavior:  Behavior normal.    ------------------------------------------------------------------------------------------------------------------------------------------------------------------------------------------------------------------- Assessment and Plan  Generalized anxiety disorder with panic attacks We discussed alternatives to traditional SSRI and will try Trintellix.  Alternatively we could try Viibryd as well if Trintellix is not covered.  We discussed that the goal would be to reduce his alprazolam use.  Insomnia secondary to anxiety He may continue Ambien CR as needed for insomnia.   Meds ordered this encounter  Medications   vortioxetine HBr (TRINTELLIX) 10 MG TABS tablet    Sig: Take 1 tablet (10 mg total) by mouth daily.    Dispense:  30 tablet    Refill:  3    Return in about 3 months (around 10/21/2020) for GAD.    This visit occurred during the SARS-CoV-2 public health emergency.  Safety protocols were in place, including screening questions prior to the visit, additional usage of staff PPE, and extensive cleaning of exam room while observing appropriate contact time as indicated for disinfecting solutions.

## 2020-07-27 ENCOUNTER — Encounter: Payer: Self-pay | Admitting: Family Medicine

## 2020-07-27 ENCOUNTER — Other Ambulatory Visit: Payer: Self-pay | Admitting: Family Medicine

## 2020-07-27 MED ORDER — VILAZODONE HCL 10 MG PO TABS: ORAL_TABLET | ORAL | 0 refills | Status: DC

## 2020-08-18 ENCOUNTER — Other Ambulatory Visit: Payer: Self-pay | Admitting: Family Medicine

## 2020-08-18 NOTE — Telephone Encounter (Signed)
Refilled 1 month ago with a refill should not need medication.

## 2020-08-20 ENCOUNTER — Encounter: Payer: Self-pay | Admitting: Family Medicine

## 2020-08-20 MED ORDER — ALPRAZOLAM 0.5 MG PO TABS: ORAL_TABLET | ORAL | 1 refills | Status: DC

## 2020-08-20 NOTE — Telephone Encounter (Signed)
Pt has appt on 08/25/2020 with Dr. Ashley Royalty.  Tiajuana Amass, CMA

## 2020-08-25 ENCOUNTER — Encounter: Payer: Self-pay | Admitting: Family Medicine

## 2020-08-25 ENCOUNTER — Other Ambulatory Visit: Payer: Self-pay

## 2020-08-25 ENCOUNTER — Ambulatory Visit (INDEPENDENT_AMBULATORY_CARE_PROVIDER_SITE_OTHER): Payer: No Typology Code available for payment source | Admitting: Family Medicine

## 2020-08-25 VITALS — BP 134/85 | HR 89 | Ht 67.0 in | Wt 145.3 lb

## 2020-08-25 DIAGNOSIS — Z1211 Encounter for screening for malignant neoplasm of colon: Secondary | ICD-10-CM | POA: Diagnosis not present

## 2020-08-25 DIAGNOSIS — Z13220 Encounter for screening for lipoid disorders: Secondary | ICD-10-CM

## 2020-08-25 DIAGNOSIS — Z0000 Encounter for general adult medical examination without abnormal findings: Secondary | ICD-10-CM | POA: Diagnosis not present

## 2020-08-25 NOTE — Progress Notes (Signed)
Vernon Sellers - 45 y.o. male MRN [id number]  Date of birth: 06-07-1975  Subjective Chief Complaint  Patient presents with   Annual Exam    HPI Vernon Sellers is a 45 year old male here today for annual exam.  Reports that overall he is doing well.  He has no new concerns today.  He is having his upper teeth removed in the next couple weeks in preparation for dentures due to poor dentition and periodontal disease  He does not exercise regularly.  He feels like his diet is fairly good.  He does smoke approximately 2 packs/week.  He consumes alcohol on an infrequent basis.  He has never had colon cancer screening.  He would like to be scheduled for colonoscopy.  He does not want a flu shot.  Review of Systems  Constitutional:  Negative for chills, fever, malaise/fatigue and weight loss.  HENT:  Negative for congestion, ear pain and sore throat.   Eyes:  Negative for blurred vision, double vision and pain.  Respiratory:  Negative for cough and shortness of breath.   Cardiovascular:  Negative for chest pain and palpitations.  Gastrointestinal:  Negative for abdominal pain, blood in stool, constipation, heartburn and nausea.  Genitourinary:  Negative for dysuria and urgency.  Musculoskeletal:  Negative for joint pain and myalgias.  Neurological:  Negative for dizziness and headaches.  Endo/Heme/Allergies:  Does not bruise/bleed easily.  Psychiatric/Behavioral:  Negative for depression. The patient is not nervous/anxious and does not have insomnia.    Allergies  Allergen Reactions   Celebrex [Celecoxib] Palpitations    Past Medical History:  Diagnosis Date   Anxiety    Asthma    History of chicken pox    LBP (low back pain)     Past Surgical History:  Procedure Laterality Date   TONSILLECTOMY      Social History   Socioeconomic History   Marital status: Single    Spouse name: Not on file   Number of children: 0   Years of education: Not on file   Highest education level:  Not on file  Occupational History   Occupation: [employer]     Comment: Consulting civil engineer  Tobacco Use   Smoking status: Every Day    Packs/day: 1.00    Years: 30.00    Pack years: 30.00    Types: Cigarettes    Start date: 01/02/1990    Passive exposure: Never   Smokeless tobacco: Never  Vaping Use   Vaping Use: Former  Substance and Sexual Activity   Alcohol use: Yes    Alcohol/week: 3.0 standard drinks    Types: 3 Standard drinks or equivalent per week   Drug use: No   Sexual activity: Not Currently    Partners: Female  Other Topics Concern   Not on file  Social History Narrative   Not on file   Social Determinants of Health   Financial Resource Strain: Not on file  Food Insecurity: Not on file  Transportation Needs: Not on file  Physical Activity: Not on file  Stress: Not on file  Social Connections: Not on file    Family History  Problem Relation Age of Onset   Kidney Stones Mother    Hepatitis C Father    Diabetes Father    Hypertension Father    Diabetes Maternal Grandmother    Breast cancer Paternal Grandmother        Metastatic   Heart disease Paternal Grandfather    Heart attack Paternal Grandfather    Heart  failure Paternal Grandfather    Heart attack Maternal Uncle    Prostate cancer Maternal Uncle     Health Maintenance  Topic Date Due   Pneumococcal Vaccine 64-63 Years old (1 - PCV) Never done   HIV Screening  Never done   Hepatitis C Screening  Never done   COVID-19 Vaccine (1) 09/02/2020 (Originally 02/09/1980)   INFLUENZA VACCINE  04/01/2021 (Originally 08/02/2020)   COLONOSCOPY (Pts 45-55yrs Insurance coverage will need to be confirmed)  04/08/2021 (Originally 02/09/2020)   TETANUS/TDAP  03/03/2028   HPV VACCINES  Aged Out     ----------------------------------------------------------------------------------------------------------------------------------------------------------------------------------------------------------------- Physical  Exam BP 134/85 (BP Location: Left Arm, Patient Position: Sitting, Cuff Size: Normal)   Pulse 89   Ht 5\' 7"  (1.702 m)   Wt 145 lb 4.8 oz (65.9 kg)   SpO2 100%   BMI 22.76 kg/m   Physical Exam Constitutional:      General: He is not in acute distress. HENT:     Head: Normocephalic and atraumatic.     Right Ear: Tympanic membrane and external ear normal.     Left Ear: Tympanic membrane and external ear normal.  Eyes:     General: No scleral icterus. Neck:     Thyroid: No thyromegaly.  Cardiovascular:     Rate and Rhythm: Normal rate and regular rhythm.     Heart sounds: Normal heart sounds.  Pulmonary:     Effort: Pulmonary effort is normal.     Breath sounds: Normal breath sounds.  Abdominal:     General: Bowel sounds are normal. There is no distension.     Palpations: Abdomen is soft.     Tenderness: There is no abdominal tenderness. There is no guarding.  Musculoskeletal:     Cervical back: Normal range of motion.  Lymphadenopathy:     Cervical: No cervical adenopathy.  Skin:    General: Skin is warm and dry.     Findings: No rash.  Neurological:     Mental Status: He is alert and oriented to person, place, and time.     Cranial Nerves: No cranial nerve deficit.     Motor: No abnormal muscle tone.  Psychiatric:        Mood and Affect: Mood normal.        Behavior: Behavior normal.    ------------------------------------------------------------------------------------------------------------------------------------------------------------------------------------------------------------------- Assessment and Plan  Well adult exam Well adult Orders Placed This Encounter  Procedures   COMPLETE METABOLIC PANEL WITH GFR   CBC with Differential   Lipid Panel w/reflex Direct LDL   Ambulatory referral to Gastroenterology    Referral Priority:   Routine    Referral Type:   Consultation    Referral Reason:   Specialty Services Required    Number of Visits Requested:    1  Screening: Referral placed for colonoscopy Immunizations: Declines flu vaccine Anticipatory guidance/risk factor reduction: Recommend smoking cessation.  Additional recommendations per AVS.   No orders of the defined types were placed in this encounter.   No follow-ups on file.    This visit occurred during the SARS-CoV-2 public health emergency.  Safety protocols were in place, including screening questions prior to the visit, additional usage of staff PPE, and extensive cleaning of exam room while observing appropriate contact time as indicated for disinfecting solutions.

## 2020-08-25 NOTE — Assessment & Plan Note (Signed)
Well adult Orders Placed This Encounter  Procedures  . COMPLETE METABOLIC PANEL WITH GFR  . CBC with Differential  . Lipid Panel w/reflex Direct LDL  . Ambulatory referral to Gastroenterology    Referral Priority:   Routine    Referral Type:   Consultation    Referral Reason:   Specialty Services Required    Number of Visits Requested:   1  Screening: Referral placed for colonoscopy Immunizations: Declines flu vaccine Anticipatory guidance/risk factor reduction: Recommend smoking cessation.  Additional recommendations per AVS.

## 2020-08-25 NOTE — Patient Instructions (Addendum)
Preventive Care 40-45 Years Old, Male Preventive care refers to lifestyle choices and visits with your health care provider that can promote health and wellness. This includes: A yearly physical exam. This is also called an annual wellness visit. Regular dental and eye exams. Immunizations. Screening for certain conditions. Healthy lifestyle choices, such as: Eating a healthy diet. Getting regular exercise. Not using drugs or products that contain nicotine and tobacco. Limiting alcohol use. What can I expect for my preventive care visit? Physical exam Your health care provider will check your: Height and weight. These may be used to calculate your BMI (body mass index). BMI is a measurement that tells if you are at a healthy weight. Heart rate and blood pressure. Body temperature. Skin for abnormal spots. Counseling Your health care provider may ask you questions about your: Past medical problems. Family's medical history. Alcohol, tobacco, and drug use. Emotional well-being. Home life and relationship well-being. Sexual activity. Diet, exercise, and sleep habits. Work and work environment. Access to firearms. What immunizations do I need?  Vaccines are usually given at various ages, according to a schedule. Your health care provider will recommend vaccines for you based on your age, medicalhistory, and lifestyle or other factors, such as travel or where you work. What tests do I need? Blood tests Lipid and cholesterol levels. These may be checked every 5 years, or more often if you are over 50 years old. Hepatitis C test. Hepatitis B test. Screening Lung cancer screening. You may have this screening every year starting at age 55 if you have a 30-pack-year history of smoking and currently smoke or have quit within the past 15 years. Prostate cancer screening. Recommendations will vary depending on your family history and other risks. Genital exam to check for testicular cancer  or hernias. Colorectal cancer screening. All adults should have this screening starting at age 50 and continuing until age 75. Your health care provider may recommend screening at age 45 if you are at increased risk. You will have tests every 1-10 years, depending on your results and the type of screening test. Diabetes screening. This is done by checking your blood sugar (glucose) after you have not eaten for a while (fasting). You may have this done every 1-3 years. STD (sexually transmitted disease) testing, if you are at risk. Follow these instructions at home: Eating and drinking  Eat a diet that includes fresh fruits and vegetables, whole grains, lean protein, and low-fat dairy products. Take vitamin and mineral supplements as recommended by your health care provider. Do not drink alcohol if your health care provider tells you not to drink. If you drink alcohol: Limit how much you have to 0-2 drinks a day. Be aware of how much alcohol is in your drink. In the [place] one drink equals one 12 oz bottle of beer (355 mL), one 5 oz glass of wine (148 mL), or one 1 oz glass of hard liquor (44 mL).  Lifestyle Take daily care of your teeth and gums. Brush your teeth every morning and night with fluoride toothpaste. Floss one time each day. Stay active. Exercise for at least 30 minutes 5 or more days each week. Do not use any products that contain nicotine or tobacco, such as cigarettes, e-cigarettes, and chewing tobacco. If you need help quitting, ask your health care provider. Do not use drugs. If you are sexually active, practice safe sex. Use a condom or other form of protection to prevent STIs (sexually transmitted infections). If told by   your health care provider, take low-dose aspirin daily starting at age 50. Find healthy ways to cope with stress, such as: Meditation, yoga, or listening to music. Journaling. Talking to a trusted person. Spending time with friends and  family. Safety Always wear your seat belt while driving or riding in a vehicle. Do not drive: If you have been drinking alcohol. Do not ride with someone who has been drinking. When you are tired or distracted. While texting. Wear a helmet and other protective equipment during sports activities. If you have firearms in your house, make sure you follow all gun safety procedures. What's next? Go to your health care provider once a year for an annual wellness visit. Ask your health care provider how often you should have your eyes and teeth checked. Stay up to date on all vaccines. This information is not intended to replace advice given to you by your health care provider. Make sure you discuss any questions you have with your healthcare provider. Document Revised: 09/17/2018 Document Reviewed: 12/13/2017 Elsevier Patient Education  2022 Elsevier Inc.  

## 2020-08-26 LAB — CBC WITH DIFFERENTIAL/PLATELET
Absolute Monocytes: 622 cells/uL (ref 200–950)
Basophils Absolute: 67 cells/uL (ref 0–200)
Basophils Relative: 0.8 %
Eosinophils Absolute: 311 cells/uL (ref 15–500)
Eosinophils Relative: 3.7 %
HCT: 47.1 % (ref 38.5–50.0)
Hemoglobin: 16.4 g/dL (ref 13.2–17.1)
Lymphs Abs: 1924 cells/uL (ref 850–3900)
MCH: 33 pg (ref 27.0–33.0)
MCHC: 34.8 g/dL (ref 32.0–36.0)
MCV: 94.8 fL (ref 80.0–100.0)
MPV: 11.1 fL (ref 7.5–12.5)
Monocytes Relative: 7.4 %
Neutro Abs: 5477 cells/uL (ref 1500–7800)
Neutrophils Relative %: 65.2 %
Platelets: 301 10*3/uL (ref 140–400)
RBC: 4.97 10*6/uL (ref 4.20–5.80)
RDW: 12.1 % (ref 11.0–15.0)
Total Lymphocyte: 22.9 %
WBC: 8.4 10*3/uL (ref 3.8–10.8)

## 2020-08-26 LAB — COMPLETE METABOLIC PANEL WITH GFR
AG Ratio: 2.2 (calc) (ref 1.0–2.5)
ALT: 26 U/L (ref 9–46)
AST: 22 U/L (ref 10–40)
Albumin: 4.9 g/dL (ref 3.6–5.1)
Alkaline phosphatase (APISO): 68 U/L (ref 36–130)
BUN: 24 mg/dL (ref 7–25)
CO2: 28 mmol/L (ref 20–32)
Calcium: 10 mg/dL (ref 8.6–10.3)
Chloride: 104 mmol/L (ref 98–110)
Creat: 0.94 mg/dL (ref 0.60–1.29)
Globulin: 2.2 g/dL (calc) (ref 1.9–3.7)
Glucose, Bld: 82 mg/dL (ref 65–99)
Potassium: 4.2 mmol/L (ref 3.5–5.3)
Sodium: 140 mmol/L (ref 135–146)
Total Bilirubin: 0.9 mg/dL (ref 0.2–1.2)
Total Protein: 7.1 g/dL (ref 6.1–8.1)
eGFR: 102 mL/min/{1.73_m2} (ref >=60)

## 2020-08-26 LAB — LIPID PANEL W/REFLEX DIRECT LDL
Cholesterol: 168 mg/dL (ref <200)
HDL: 54 mg/dL (ref >=40)
LDL Cholesterol (Calc): 95 mg/dL (calc)
Non-HDL Cholesterol (Calc): 114 mg/dL (calc) (ref <130)
Total CHOL/HDL Ratio: 3.1 (calc) (ref <5.0)
Triglycerides: 99 mg/dL (ref <150)

## 2020-08-29 ENCOUNTER — Other Ambulatory Visit: Payer: Self-pay | Admitting: Family Medicine

## 2020-09-01 ENCOUNTER — Other Ambulatory Visit: Payer: Self-pay | Admitting: Family Medicine

## 2020-09-01 DIAGNOSIS — F419 Anxiety disorder, unspecified: Secondary | ICD-10-CM

## 2020-09-01 DIAGNOSIS — F5105 Insomnia due to other mental disorder: Secondary | ICD-10-CM

## 2020-10-21 ENCOUNTER — Ambulatory Visit: Payer: No Typology Code available for payment source | Admitting: Family Medicine

## 2020-10-21 ENCOUNTER — Encounter: Payer: Self-pay | Admitting: Family Medicine

## 2020-10-21 MED ORDER — ALPRAZOLAM 0.5 MG PO TABS: ORAL_TABLET | ORAL | 1 refills | Status: DC

## 2020-11-22 ENCOUNTER — Other Ambulatory Visit: Payer: Self-pay | Admitting: Family Medicine

## 2020-12-26 ENCOUNTER — Other Ambulatory Visit: Payer: Self-pay | Admitting: Family Medicine

## 2020-12-26 DIAGNOSIS — F5105 Insomnia due to other mental disorder: Secondary | ICD-10-CM

## 2021-02-21 ENCOUNTER — Other Ambulatory Visit: Payer: Self-pay | Admitting: Family Medicine

## 2021-04-17 ENCOUNTER — Other Ambulatory Visit: Payer: Self-pay | Admitting: Family Medicine

## 2021-04-17 DIAGNOSIS — F419 Anxiety disorder, unspecified: Secondary | ICD-10-CM

## 2021-06-16 ENCOUNTER — Other Ambulatory Visit: Payer: Self-pay | Admitting: Family Medicine

## 2021-08-15 ENCOUNTER — Telehealth: Payer: Self-pay | Admitting: Family Medicine

## 2021-08-15 DIAGNOSIS — F419 Anxiety disorder, unspecified: Secondary | ICD-10-CM

## 2021-08-15 NOTE — Telephone Encounter (Signed)
LVM for patient to call back to get f/u appointment scheduled with PCP for any further med refills. AMUCK

## 2021-08-15 NOTE — Telephone Encounter (Signed)
Pt has scheduled an appt for his F/u but is wanting to know if he can go ahead and get enough Xanax to last until his scheduled appt?

## 2021-08-15 NOTE — Telephone Encounter (Signed)
Pls contact patient to schedule appt. Unable to provide refills without appt scheduled. Last seen 08/25/20. Thanks

## 2021-08-17 ENCOUNTER — Other Ambulatory Visit: Payer: Self-pay | Admitting: Family Medicine

## 2021-08-17 NOTE — Telephone Encounter (Signed)
Patient has an appt on 8/30 with PCP.

## 2021-08-31 ENCOUNTER — Encounter: Payer: Self-pay | Admitting: Family Medicine

## 2021-08-31 ENCOUNTER — Ambulatory Visit (INDEPENDENT_AMBULATORY_CARE_PROVIDER_SITE_OTHER): Payer: No Typology Code available for payment source | Admitting: Family Medicine

## 2021-08-31 VITALS — BP 145/84 | HR 95 | Ht 67.0 in | Wt 139.0 lb

## 2021-08-31 DIAGNOSIS — F410 Panic disorder [episodic paroxysmal anxiety] without agoraphobia: Secondary | ICD-10-CM

## 2021-08-31 DIAGNOSIS — F411 Generalized anxiety disorder: Secondary | ICD-10-CM

## 2021-08-31 DIAGNOSIS — F5105 Insomnia due to other mental disorder: Secondary | ICD-10-CM | POA: Diagnosis not present

## 2021-08-31 DIAGNOSIS — F419 Anxiety disorder, unspecified: Secondary | ICD-10-CM | POA: Diagnosis not present

## 2021-08-31 MED ORDER — ALPRAZOLAM 0.5 MG PO TABS: ORAL_TABLET | ORAL | 1 refills | Status: DC

## 2021-08-31 MED ORDER — ZOLPIDEM TARTRATE ER 12.5 MG PO TBCR: EXTENDED_RELEASE_TABLET | ORAL | 3 refills | Status: DC

## 2021-08-31 MED ORDER — CITALOPRAM HYDROBROMIDE 10 MG PO TABS: 10.0000 mg | ORAL_TABLET | Freq: Every day | ORAL | 3 refills | Status: DC

## 2021-08-31 NOTE — Patient Instructions (Signed)
Really try to limit use of alprazolam.   I am adding citalopram to help with daily anxiety.  Be sure to take this daily.  Start with 1/2 tab for 10 days then increase to a full tab.  See me again in 6 weeks.

## 2021-08-31 NOTE — Assessment & Plan Note (Signed)
Will continue ambien as needed.

## 2021-08-31 NOTE — Progress Notes (Signed)
Crimson Beer - 46 y.o. male MRN [id number]  Date of birth: [birth year]  Subjective Chief Complaint  Patient presents with   Angioedema   Medication Refill    HPI Vernon Sellers is a 46 y.o. male here today for follow up visit.   Continues on alprazolam for management of anxiety.  Taking alprazolam on a scheduled basis twice per day.  Has tried multiple SSRI's in the past which he did not tolerate well.  He continues to have significant anxiety.  Also with difficulty sleeping and has ambien cr as needed.   Recently got new upper dentures.  Doing pretty well with these.   Due for colon cancer screening.  Undecided on where he wants this completed.   ROS:  A comprehensive ROS was completed and negative except as noted per HPI  Allergies  Allergen Reactions   Celebrex [Celecoxib] Palpitations    Past Medical History:  Diagnosis Date   Anxiety    Asthma    History of chicken pox    LBP (low back pain)     Past Surgical History:  Procedure Laterality Date   TONSILLECTOMY      Social History   Socioeconomic History   Marital status: Single    Spouse name: Not on file   Number of children: 0   Years of education: Not on file   Highest education level: Not on file  Occupational History   Occupation: [employer]     Comment: Consulting civil engineer  Tobacco Use   Smoking status: Every Day    Packs/day: 1.00    Years: 30.00    Total pack years: 30.00    Types: Cigarettes    Start date: 01/02/1990    Passive exposure: Never   Smokeless tobacco: Never  Vaping Use   Vaping Use: Former  Substance and Sexual Activity   Alcohol use: Yes    Alcohol/week: 3.0 standard drinks of alcohol    Types: 3 Standard drinks or equivalent per week   Drug use: No   Sexual activity: Not Currently    Partners: Female  Other Topics Concern   Not on file  Social History Narrative   Not on file   Social Determinants of Health   Financial Resource Strain: Not on file  Food Insecurity:  Not on file  Transportation Needs: Not on file  Physical Activity: Not on file  Stress: Not on file  Social Connections: Not on file    Family History  Problem Relation Age of Onset   Kidney Stones Mother    Hepatitis C Father    Diabetes Father    Hypertension Father    Diabetes Maternal Grandmother    Breast cancer Paternal Grandmother        Metastatic   Heart disease Paternal Grandfather    Heart attack Paternal Grandfather    Heart failure Paternal Grandfather    Heart attack Maternal Uncle    Prostate cancer Maternal Uncle     Health Maintenance  Topic Date Due   COVID-19 Vaccine (1) 02/02/2022 (Originally 08/09/1975)   INFLUENZA VACCINE  04/02/2022 (Originally 08/02/2021)   COLONOSCOPY (Pts 45-34yrs Insurance coverage will need to be confirmed)  09/01/2022 (Originally 02/09/2020)   Hepatitis C Screening  09/01/2022 (Originally 02/08/1993)   HIV Screening  09/01/2022 (Originally 02/08/1990)   TETANUS/TDAP  03/03/2028   HPV VACCINES  Aged Out     ----------------------------------------------------------------------------------------------------------------------------------------------------------------------------------------------------------------- Physical Exam BP (!) 145/84 (BP Location: Left Arm, Patient Position: Sitting, Cuff Size: Normal)   Pulse 95  Ht 5\' 7"  (1.702 m)   Wt 139 lb (63 kg)   SpO2 100%   BMI 21.77 kg/m   Physical Exam Constitutional:      Appearance: Normal appearance.  Neurological:     Mental Status: He is alert.  Psychiatric:        Mood and Affect: Mood normal.        Behavior: Behavior normal.     ------------------------------------------------------------------------------------------------------------------------------------------------------------------------------------------------------------------- Assessment and Plan  Generalized anxiety disorder with panic attacks Reiterated plan to back down on alprazolam and slowly wean  from this.  Has tried multiple SSRI's.  Reviewed medications he has tried and states that he doesn't think that he tried citalopram.  Starting at 5mg  x10 days with increase to 10mg  thereafter.  Will start to taper alprazolam at next visit.     Insomnia secondary to anxiety Will continue ambien as needed.    Meds ordered this encounter  Medications   ALPRAZolam (XANAX) 0.5 MG tablet    Sig: TAKE 1/2 TO 1 TABLET BY MOUTH TWICE A DAY AS NEEDED FOR ANXIETY.  60 tabs must last 1 month    Dispense:  60 tablet    Refill:  1    Not to exceed 4 additional fills before 10/15/2021   zolpidem (AMBIEN CR) 12.5 MG CR tablet    Sig: TAKE 1 TABLET BY MOUTH AT BEDTIME AS NEEDED FOR SLEEP    Dispense:  15 tablet    Refill:  3    Not to exceed 2 additional fills before 06/27/2021   citalopram (CELEXA) 10 MG tablet    Sig: Take 1 tablet (10 mg total) by mouth daily. Take 5mg  daily x 10 days then increase to 10mg  daily.    Dispense:  30 tablet    Refill:  3    Return in about 6 weeks (around 10/12/2021) for GAD.    This visit occurred during the SARS-CoV-2 public health emergency.  Safety protocols were in place, including screening questions prior to the visit, additional usage of staff PPE, and extensive cleaning of exam room while observing appropriate contact time as indicated for disinfecting solutions.

## 2021-08-31 NOTE — Assessment & Plan Note (Signed)
Reiterated plan to back down on alprazolam and slowly wean from this.  Has tried multiple SSRI's.  Reviewed medications he has tried and states that he doesn't think that he tried citalopram.  Starting at 5mg  x10 days with increase to 10mg  thereafter.  Will start to taper alprazolam at next visit.

## 2021-10-12 ENCOUNTER — Encounter: Payer: Self-pay | Admitting: Family Medicine

## 2021-10-12 ENCOUNTER — Ambulatory Visit (INDEPENDENT_AMBULATORY_CARE_PROVIDER_SITE_OTHER): Payer: No Typology Code available for payment source | Admitting: Family Medicine

## 2021-10-12 VITALS — BP 129/80 | HR 80 | Ht 67.0 in | Wt 139.0 lb

## 2021-10-12 DIAGNOSIS — Z1211 Encounter for screening for malignant neoplasm of colon: Secondary | ICD-10-CM

## 2021-10-12 DIAGNOSIS — F410 Panic disorder [episodic paroxysmal anxiety] without agoraphobia: Secondary | ICD-10-CM

## 2021-10-12 DIAGNOSIS — F411 Generalized anxiety disorder: Secondary | ICD-10-CM

## 2021-10-12 MED ORDER — CITALOPRAM HYDROBROMIDE 10 MG PO TABS: 10.0000 mg | ORAL_TABLET | Freq: Every day | ORAL | 2 refills | Status: DC

## 2021-10-12 NOTE — Assessment & Plan Note (Signed)
Doing fairly well on Celexa at this time.  Recommend continuation of current strength.  We discussed goal is to reduce his alprazolam use.  Discussed only using for severe anxiety at this time.  We will try to taper some at next visit.

## 2021-10-12 NOTE — Progress Notes (Signed)
Vernon Sellers - 46 y.o. male MRN [id number]  Date of birth: [birth year]  Subjective Chief Complaint  Patient presents with   Anxiety    HPI Vernon Sellers is a 47 year old male here today for follow-up visit.  Celexa at a previous visit.  Reports he is tolerating this fairly well.  He does feel that anxiety has improved with taking this.  He does continue on alprazolam as needed.  ROS:  A comprehensive ROS was completed and negative except as noted per HPI  Allergies  Allergen Reactions   Celebrex [Celecoxib] Palpitations    Past Medical History:  Diagnosis Date   Anxiety    Asthma    History of chicken pox    LBP (low back pain)     Past Surgical History:  Procedure Laterality Date   TONSILLECTOMY      Social History   Socioeconomic History   Marital status: Single    Spouse name: Not on file   Number of children: 0   Years of education: Not on file   Highest education level: Not on file  Occupational History   Occupation: [employer]     Comment: Consulting civil engineer  Tobacco Use   Smoking status: Every Day    Packs/day: 1.00    Years: 30.00    Total pack years: 30.00    Types: Cigarettes    Start date: 01/02/1990    Passive exposure: Never   Smokeless tobacco: Never  Vaping Use   Vaping Use: Former  Substance and Sexual Activity   Alcohol use: Yes    Alcohol/week: 3.0 standard drinks of alcohol    Types: 3 Standard drinks or equivalent per week   Drug use: No   Sexual activity: Not Currently    Partners: Female  Other Topics Concern   Not on file  Social History Narrative   Not on file   Social Determinants of Health   Financial Resource Strain: Not on file  Food Insecurity: Not on file  Transportation Needs: Not on file  Physical Activity: Not on file  Stress: Not on file  Social Connections: Not on file    Family History  Problem Relation Age of Onset   Kidney Stones Mother    Hepatitis C Father    Diabetes Father    Hypertension Father     Diabetes Maternal Grandmother    Breast cancer Paternal Grandmother        Metastatic   Heart disease Paternal Grandfather    Heart attack Paternal Grandfather    Heart failure Paternal Grandfather    Heart attack Maternal Uncle    Prostate cancer Maternal Uncle     Health Maintenance  Topic Date Due   COVID-19 Vaccine (1) 02/02/2022 (Originally 08/09/1975)   INFLUENZA VACCINE  04/02/2022 (Originally 08/02/2021)   COLONOSCOPY (Pts 45-96yrs Insurance coverage will need to be confirmed)  09/01/2022 (Originally 02/09/2020)   Hepatitis C Screening  09/01/2022 (Originally 02/08/1993)   HIV Screening  09/01/2022 (Originally 02/08/1990)   TETANUS/TDAP  03/03/2028   HPV VACCINES  Aged Out     ----------------------------------------------------------------------------------------------------------------------------------------------------------------------------------------------------------------- Physical Exam BP 129/80 (BP Location: Left Arm, Patient Position: Sitting, Cuff Size: Normal)   Pulse 80   Ht 5\' 7"  (1.702 m)   Wt 139 lb (63 kg)   SpO2 100%   BMI 21.77 kg/m   Physical Exam Constitutional:      Appearance: Normal appearance.  Eyes:     General: No scleral icterus. Neurological:     Mental Status: He is  alert.  Psychiatric:        Mood and Affect: Mood normal.        Behavior: Behavior normal.     ------------------------------------------------------------------------------------------------------------------------------------------------------------------------------------------------------------------- Assessment and Plan  Generalized anxiety disorder with panic attacks Doing fairly well on Celexa at this time.  Recommend continuation of current strength.  We discussed goal is to reduce his alprazolam use.  Discussed only using for severe anxiety at this time.  We will try to taper some at next visit.   Meds ordered this encounter  Medications   citalopram (CELEXA)  10 MG tablet    Sig: Take 1 tablet (10 mg total) by mouth daily.    Dispense:  90 tablet    Refill:  2    Return in about 6 months (around 04/13/2022) for GAD.    This visit occurred during the SARS-CoV-2 public health emergency.  Safety protocols were in place, including screening questions prior to the visit, additional usage of staff PPE, and extensive cleaning of exam room while observing appropriate contact time as indicated for disinfecting solutions.

## 2021-10-30 ENCOUNTER — Other Ambulatory Visit: Payer: Self-pay | Admitting: Family Medicine

## 2021-10-30 DIAGNOSIS — F411 Generalized anxiety disorder: Secondary | ICD-10-CM

## 2021-10-31 ENCOUNTER — Telehealth: Payer: Self-pay

## 2021-11-01 NOTE — Telephone Encounter (Signed)
Completed.

## 2021-11-03 ENCOUNTER — Ambulatory Visit: Payer: No Typology Code available for payment source | Admitting: Family Medicine

## 2021-11-07 ENCOUNTER — Ambulatory Visit: Payer: No Typology Code available for payment source | Admitting: Family Medicine

## 2021-11-10 ENCOUNTER — Ambulatory Visit (INDEPENDENT_AMBULATORY_CARE_PROVIDER_SITE_OTHER): Payer: No Typology Code available for payment source | Admitting: Family Medicine

## 2021-11-10 ENCOUNTER — Encounter: Payer: Self-pay | Admitting: Family Medicine

## 2021-11-10 VITALS — BP 125/82 | HR 78 | Ht 67.0 in | Wt 137.0 lb

## 2021-11-10 DIAGNOSIS — F411 Generalized anxiety disorder: Secondary | ICD-10-CM

## 2021-11-10 DIAGNOSIS — Z13220 Encounter for screening for lipoid disorders: Secondary | ICD-10-CM

## 2021-11-10 DIAGNOSIS — F410 Panic disorder [episodic paroxysmal anxiety] without agoraphobia: Secondary | ICD-10-CM

## 2021-11-10 DIAGNOSIS — R220 Localized swelling, mass and lump, head: Secondary | ICD-10-CM | POA: Diagnosis not present

## 2021-11-10 DIAGNOSIS — Z0000 Encounter for general adult medical examination without abnormal findings: Secondary | ICD-10-CM | POA: Diagnosis not present

## 2021-11-10 NOTE — Progress Notes (Signed)
Vernon Sellers - 46 y.o. male MRN [id number]  Date of birth: August 03, 1975  Subjective Chief Complaint  Patient presents with   Annual Exam    HPI Vernon Sellers is a 46 y.o. male here today for annual exam.   Reports that he is doing well at this time.  He has noted a couple episodes where his lips have swollen.  This improves with ibuprofen.  He has upper dentures.   He does stay somewhat active. He feels that his diet is pretty good.   He does continue to smoke about 1/4 ppd.  Occasional EtOH use.   He does not want a flu shot at this time.   Review of Systems  Constitutional:  Negative for chills, fever, malaise/fatigue and weight loss.  HENT:  Negative for congestion, ear pain and sore throat.   Eyes:  Negative for blurred vision, double vision and pain.  Respiratory:  Negative for cough and shortness of breath.   Cardiovascular:  Negative for chest pain and palpitations.  Gastrointestinal:  Negative for abdominal pain, blood in stool, constipation, heartburn and nausea.  Genitourinary:  Negative for dysuria and urgency.  Musculoskeletal:  Negative for joint pain and myalgias.  Neurological:  Negative for dizziness and headaches.  Endo/Heme/Allergies:  Does not bruise/bleed easily.  Psychiatric/Behavioral:  Negative for depression. The patient is not nervous/anxious and does not have insomnia.     Allergies  Allergen Reactions   Celebrex [Celecoxib] Palpitations    Past Medical History:  Diagnosis Date   Anxiety    Asthma    History of chicken pox    LBP (low back pain)     Past Surgical History:  Procedure Laterality Date   TONSILLECTOMY      Social History   Socioeconomic History   Marital status: Single    Spouse name: Not on file   Number of children: 0   Years of education: Not on file   Highest education level: Not on file  Occupational History   Occupation: [employer]     Comment: Consulting civil engineer  Tobacco Use   Smoking status: Every Day     Packs/day: 1.00    Years: 30.00    Total pack years: 30.00    Types: Cigarettes    Start date: 01/02/1990    Passive exposure: Never   Smokeless tobacco: Never  Vaping Use   Vaping Use: Former  Substance and Sexual Activity   Alcohol use: Yes    Alcohol/week: 3.0 standard drinks of alcohol    Types: 3 Standard drinks or equivalent per week   Drug use: No   Sexual activity: Not Currently    Partners: Female  Other Topics Concern   Not on file  Social History Narrative   Not on file   Social Determinants of Health   Financial Resource Strain: Not on file  Food Insecurity: Not on file  Transportation Needs: Not on file  Physical Activity: Not on file  Stress: Not on file  Social Connections: Not on file    Family History  Problem Relation Age of Onset   Kidney Stones Mother    Hepatitis C Father    Diabetes Father    Hypertension Father    Diabetes Maternal Grandmother    Breast cancer Paternal Grandmother        Metastatic   Heart disease Paternal Grandfather    Heart attack Paternal Grandfather    Heart failure Paternal Grandfather    Heart attack Maternal Uncle  Prostate cancer Maternal Uncle     Health Maintenance  Topic Date Due   COVID-19 Vaccine (1) 02/02/2022 (Originally 08/09/1975)   INFLUENZA VACCINE  04/02/2022 (Originally 08/02/2021)   COLONOSCOPY (Pts 45-46yrs Insurance coverage will need to be confirmed)  09/01/2022 (Originally 02/09/2020)   Hepatitis C Screening  09/01/2022 (Originally 02/08/1993)   HIV Screening  09/01/2022 (Originally 02/08/1990)   TETANUS/TDAP  03/03/2028   HPV VACCINES  Aged Out     ----------------------------------------------------------------------------------------------------------------------------------------------------------------------------------------------------------------- Physical Exam BP 125/82 (BP Location: Left Arm, Patient Position: Sitting, Cuff Size: Normal)   Pulse 78   Ht 5\' 7"  (1.702 m)   Wt 137 lb (62.1  kg)   SpO2 100%   BMI 21.46 kg/m   Physical Exam Constitutional:      General: He is not in acute distress. HENT:     Head: Normocephalic and atraumatic.     Right Ear: Tympanic membrane and external ear normal.     Left Ear: Tympanic membrane and external ear normal.     Mouth/Throat:     Comments: Upper dentures.  Poor dentition of lower teeth.  Eyes:     General: No scleral icterus. Neck:     Thyroid: No thyromegaly.  Cardiovascular:     Rate and Rhythm: Normal rate and regular rhythm.     Heart sounds: Normal heart sounds.  Pulmonary:     Effort: Pulmonary effort is normal.     Breath sounds: Normal breath sounds.  Abdominal:     General: Bowel sounds are normal. There is no distension.     Palpations: Abdomen is soft.     Tenderness: There is no abdominal tenderness. There is no guarding.  Musculoskeletal:     Cervical back: Normal range of motion.  Lymphadenopathy:     Cervical: No cervical adenopathy.  Skin:    General: Skin is warm and dry.     Findings: No rash.  Neurological:     Mental Status: He is alert and oriented to person, place, and time.     Cranial Nerves: No cranial nerve deficit.     Motor: No abnormal muscle tone.  Psychiatric:        Mood and Affect: Mood normal.        Behavior: Behavior normal.     ------------------------------------------------------------------------------------------------------------------------------------------------------------------------------------------------------------------- Assessment and Plan  Lip swelling This occurs occasionally.  Doesn't seem like true angioedema.  Improves with ibuprofen.  I think it may be related to lower teeth irritating his upper gums when he has his upper denture plate out.  Recommend talking to dentist about night guard.  Can try peridex mouth rinse.  He thinks he has this at home.   Well adult exam Well adult Orders Placed This Encounter  Procedures   COMPLETE METABOLIC PANEL  WITH GFR   CBC with Differential   Lipid Panel w/reflex Direct LDL   TSH  Screenings:  Per lab orders.  He has upcoming colonoscopy.  Immunizations:  Declines flu vaccine.  Anticipatory guidance/Risk factor reduction:  Recommendations per AVS.    No orders of the defined types were placed in this encounter.   No follow-ups on file.    This visit occurred during the SARS-CoV-2 public health emergency.  Safety protocols were in place, including screening questions prior to the visit, additional usage of staff PPE, and extensive cleaning of exam room while observing appropriate contact time as indicated for disinfecting solutions.

## 2021-11-10 NOTE — Patient Instructions (Signed)

## 2021-11-10 NOTE — Assessment & Plan Note (Signed)
This occurs occasionally.  Doesn't seem like true angioedema.  Improves with ibuprofen.  I think it may be related to lower teeth irritating his upper gums when he has his upper denture plate out.  Recommend talking to dentist about night guard.  Can try peridex mouth rinse.  He thinks he has this at home.

## 2021-11-10 NOTE — Assessment & Plan Note (Signed)
Well adult Orders Placed This Encounter  Procedures   COMPLETE METABOLIC PANEL WITH GFR   CBC with Differential   Lipid Panel w/reflex Direct LDL   TSH  Screenings:  Per lab orders.  He has upcoming colonoscopy.  Immunizations:  Declines flu vaccine.  Anticipatory guidance/Risk factor reduction:  Recommendations per AVS.

## 2021-11-11 LAB — LIPID PANEL W/REFLEX DIRECT LDL
Cholesterol: 155 mg/dL (ref <200)
HDL: 52 mg/dL (ref >=40)
LDL Cholesterol (Calc): 86 mg/dL (calc)
Non-HDL Cholesterol (Calc): 103 mg/dL (calc) (ref <130)
Total CHOL/HDL Ratio: 3 (calc) (ref <5.0)
Triglycerides: 83 mg/dL (ref <150)

## 2021-11-11 LAB — CBC WITH DIFFERENTIAL/PLATELET
Absolute Monocytes: 704 cells/uL (ref 200–950)
Basophils Absolute: 80 cells/uL (ref 0–200)
Basophils Relative: 1.2 %
Eosinophils Absolute: 590 cells/uL — ABNORMAL HIGH (ref 15–500)
Eosinophils Relative: 8.8 %
HCT: 46.9 % (ref 38.5–50.0)
Hemoglobin: 16.5 g/dL (ref 13.2–17.1)
Lymphs Abs: 1789 cells/uL (ref 850–3900)
MCH: 33.7 pg — ABNORMAL HIGH (ref 27.0–33.0)
MCHC: 35.2 g/dL (ref 32.0–36.0)
MCV: 95.7 fL (ref 80.0–100.0)
MPV: 10.8 fL (ref 7.5–12.5)
Monocytes Relative: 10.5 %
Neutro Abs: 3538 cells/uL (ref 1500–7800)
Neutrophils Relative %: 52.8 %
Platelets: 295 10*3/uL (ref 140–400)
RBC: 4.9 10*6/uL (ref 4.20–5.80)
RDW: 12 % (ref 11.0–15.0)
Total Lymphocyte: 26.7 %
WBC: 6.7 10*3/uL (ref 3.8–10.8)

## 2021-11-11 LAB — COMPLETE METABOLIC PANEL WITH GFR
AG Ratio: 2 (calc) (ref 1.0–2.5)
ALT: 22 U/L (ref 9–46)
AST: 19 U/L (ref 10–40)
Albumin: 4.7 g/dL (ref 3.6–5.1)
Alkaline phosphatase (APISO): 81 U/L (ref 36–130)
BUN: 20 mg/dL (ref 7–25)
CO2: 26 mmol/L (ref 20–32)
Calcium: 9.5 mg/dL (ref 8.6–10.3)
Chloride: 105 mmol/L (ref 98–110)
Creat: 0.95 mg/dL (ref 0.60–1.29)
Globulin: 2.3 g/dL (calc) (ref 1.9–3.7)
Glucose, Bld: 81 mg/dL (ref 65–99)
Potassium: 4.4 mmol/L (ref 3.5–5.3)
Sodium: 140 mmol/L (ref 135–146)
Total Bilirubin: 0.8 mg/dL (ref 0.2–1.2)
Total Protein: 7 g/dL (ref 6.1–8.1)
eGFR: 100 mL/min/{1.73_m2} (ref >=60)

## 2021-11-11 LAB — TSH: TSH: 3.4 mIU/L (ref 0.40–4.50)

## 2021-12-18 ENCOUNTER — Other Ambulatory Visit: Payer: Self-pay | Admitting: Family Medicine

## 2021-12-18 DIAGNOSIS — F419 Anxiety disorder, unspecified: Secondary | ICD-10-CM

## 2021-12-28 ENCOUNTER — Other Ambulatory Visit: Payer: Self-pay | Admitting: Family Medicine

## 2021-12-28 DIAGNOSIS — F410 Panic disorder [episodic paroxysmal anxiety] without agoraphobia: Secondary | ICD-10-CM

## 2022-01-12 ENCOUNTER — Ambulatory Visit: Payer: No Typology Code available for payment source | Admitting: Family Medicine

## 2022-01-27 ENCOUNTER — Other Ambulatory Visit: Payer: Self-pay | Admitting: Medical-Surgical

## 2022-01-27 DIAGNOSIS — F410 Panic disorder [episodic paroxysmal anxiety] without agoraphobia: Secondary | ICD-10-CM

## 2022-01-27 NOTE — Telephone Encounter (Signed)
Routing to covering provider.   Last OV: 11/10/21 Next OV: 04/18/22 Last RF: 12/08/21

## 2022-02-27 ENCOUNTER — Other Ambulatory Visit: Payer: Self-pay | Admitting: Medical-Surgical

## 2022-02-27 DIAGNOSIS — F410 Panic disorder [episodic paroxysmal anxiety] without agoraphobia: Secondary | ICD-10-CM

## 2022-03-29 ENCOUNTER — Other Ambulatory Visit: Payer: Self-pay | Admitting: Family Medicine

## 2022-03-29 DIAGNOSIS — F410 Panic disorder [episodic paroxysmal anxiety] without agoraphobia: Secondary | ICD-10-CM

## 2022-03-29 DIAGNOSIS — F411 Generalized anxiety disorder: Secondary | ICD-10-CM

## 2022-04-18 ENCOUNTER — Ambulatory Visit (INDEPENDENT_AMBULATORY_CARE_PROVIDER_SITE_OTHER): Payer: No Typology Code available for payment source | Admitting: Family Medicine

## 2022-04-18 ENCOUNTER — Encounter: Payer: Self-pay | Admitting: Family Medicine

## 2022-04-18 VITALS — BP 148/84 | HR 94 | Ht 67.0 in | Wt 139.0 lb

## 2022-04-18 DIAGNOSIS — L219 Seborrheic dermatitis, unspecified: Secondary | ICD-10-CM | POA: Diagnosis not present

## 2022-04-18 DIAGNOSIS — F419 Anxiety disorder, unspecified: Secondary | ICD-10-CM | POA: Diagnosis not present

## 2022-04-18 DIAGNOSIS — M5431 Sciatica, right side: Secondary | ICD-10-CM | POA: Diagnosis not present

## 2022-04-18 DIAGNOSIS — F411 Generalized anxiety disorder: Secondary | ICD-10-CM

## 2022-04-18 DIAGNOSIS — M5430 Sciatica, unspecified side: Secondary | ICD-10-CM | POA: Insufficient documentation

## 2022-04-18 DIAGNOSIS — F410 Panic disorder [episodic paroxysmal anxiety] without agoraphobia: Secondary | ICD-10-CM

## 2022-04-18 DIAGNOSIS — F5105 Insomnia due to other mental disorder: Secondary | ICD-10-CM

## 2022-04-18 MED ORDER — PREDNISONE 50 MG PO TABS: ORAL_TABLET | ORAL | 0 refills | Status: DC

## 2022-04-18 MED ORDER — FLUOCINONIDE 0.05 % EX SOLN: 1.0000 | Freq: Two times a day (BID) | CUTANEOUS | 0 refills | Status: DC

## 2022-04-18 MED ORDER — ZOLPIDEM TARTRATE 10 MG PO TABS: 10.0000 mg | ORAL_TABLET | Freq: Every evening | ORAL | 0 refills | Status: DC | PRN

## 2022-04-18 MED ORDER — CITALOPRAM HYDROBROMIDE 20 MG PO TABS: 20.0000 mg | ORAL_TABLET | Freq: Every day | ORAL | 1 refills | Status: DC

## 2022-04-18 NOTE — Assessment & Plan Note (Signed)
Increasing citalopram to 20 mg daily.  Will continue alprazolam at current strength.

## 2022-04-18 NOTE — Assessment & Plan Note (Signed)
Adding topical fluocinonide as he did not have response to typical antifungal symptoms.

## 2022-04-18 NOTE — Assessment & Plan Note (Signed)
Right-sided sciatica.  Adding course of prednisone and given handout for home exercise plan.  He will let me know if not improving with this.

## 2022-04-18 NOTE — Progress Notes (Signed)
Vernon Sellers - 47 y.o. male MRN [id number]  Date of birth: 1975-06-30  Subjective Chief Complaint  Patient presents with   Anxiety    HPI Vernon Sellers is a 47 y.o. male here today for follow up.  He reports that he is doing well.  Remains on combination of celexa with xanax as needed.  We have discussed limiting xanax as much as possible.  He feels that current medications are working OK for him. He does admit to some stress related to work.   Ambien CR does not seem to be as effective for associated insomnia, would like to change back to ambien IR  No noticeable side effects with this.   Has an itchy spot on his scalp.  He  has had this in the past.  OTC selenium sulfide shampoos and rx or nizoral did not help.    He is having some sciatic pain.  Pain starts in the lower back and radiates down the R leg.  No weakness, bowel/bladder incontinence.    ROS:  A comprehensive ROS was completed and negative except as noted per HPI  Allergies  Allergen Reactions   Celebrex [Celecoxib] Palpitations    Past Medical History:  Diagnosis Date   Anxiety    Asthma    History of chicken pox    LBP (low back pain)     Past Surgical History:  Procedure Laterality Date   TONSILLECTOMY      Social History   Socioeconomic History   Marital status: Single    Spouse name: Not on file   Number of children: 0   Years of education: Not on file   Highest education level: GED or equivalent  Occupational History   Occupation: [employer]     Comment: Consulting civil engineer  Tobacco Use   Smoking status: Every Day    Packs/day: 1.00    Years: 30.00    Additional pack years: 0.00    Total pack years: 30.00    Types: Cigarettes    Start date: 01/02/1990    Passive exposure: Never   Smokeless tobacco: Never  Vaping Use   Vaping Use: Former  Substance and Sexual Activity   Alcohol use: Yes    Alcohol/week: 3.0 standard drinks of alcohol    Types: 3 Standard drinks or equivalent per week    Drug use: No   Sexual activity: Not Currently    Partners: Female  Other Topics Concern   Not on file  Social History Narrative   Not on file   Social Determinants of Health   Financial Resource Strain: Low Risk  (04/16/2022)   Overall Financial Resource Strain (CARDIA)    Difficulty of Paying Living Expenses: Not very hard  Food Insecurity: No Food Insecurity (04/16/2022)   Hunger Vital Sign    Worried About Running Out of Food in the Last Year: Never true    Ran Out of Food in the Last Year: Never true  Transportation Needs: No Transportation Needs (04/16/2022)   PRAPARE - Administrator, Civil Service (Medical): No    Lack of Transportation (Non-Medical): No  Physical Activity: Unknown (04/16/2022)   Exercise Vital Sign    Days of Exercise per Week: 0 days    Minutes of Exercise per Session: Not on file  Stress: Stress Concern Present (04/16/2022)   [employer] of Occupational Health - Occupational Stress Questionnaire    Feeling of Stress : Rather much  Social Connections: Moderately Isolated (04/16/2022)   Social  Connection and Isolation Panel [NHANES]    Frequency of Communication with Friends and Family: Three times a week    Frequency of Social Gatherings with Friends and Family: Once a week    Attends Religious Services: 1 to 4 times per year    Active Member of Clubs or Organizations: No    Attends Engineer, structural: Not on file    Marital Status: Never married    Family History  Problem Relation Age of Onset   Kidney Stones Mother    Hepatitis C Father    Diabetes Father    Hypertension Father    Diabetes Maternal Grandmother    Breast cancer Paternal Grandmother        Metastatic   Heart disease Paternal Grandfather    Heart attack Paternal Grandfather    Heart failure Paternal Grandfather    Heart attack Maternal Uncle    Prostate cancer Maternal Uncle     Health Maintenance  Topic Date Due   COLONOSCOPY (Pts 45-13yrs  Insurance coverage will need to be confirmed)  09/01/2022 (Originally 02/09/2020)   Hepatitis C Screening  09/01/2022 (Originally 02/08/1993)   HIV Screening  09/01/2022 (Originally 02/08/1990)   COVID-19 Vaccine (1) 10/04/2022 (Originally [birth year]/1975)   INFLUENZA VACCINE  08/03/2022   DTaP/Tdap/Td (2 - Td or Tdap) 03/03/2028   HPV VACCINES  Aged Out     ----------------------------------------------------------------------------------------------------------------------------------------------------------------------------------------------------------------- Physical Exam BP (!) 148/84 (BP Location: Left Arm, Patient Position: Sitting, Cuff Size: Small)   Pulse 94   Ht  (1.702 m)   Wt 139 lb (63 kg)   SpO2 100%   BMI 21.77 kg/m   Physical Exam Constitutional:      Appearance: Normal appearance.  HENT:     Head: Normocephalic and atraumatic.  Eyes:     General: No scleral icterus. Musculoskeletal:     Cervical back: Neck supple.  Skin:    Comments: White, scaly patch on L parietal scalp.   Neurological:     Mental Status: He is alert.  Psychiatric:        Mood and Affect: Mood normal.        Behavior: Behavior normal.     ------------------------------------------------------------------------------------------------------------------------------------------------------------------------------------------------------------------- Assessment and Plan  Generalized anxiety disorder with panic attacks Increasing citalopram to 20 mg daily.  Will continue alprazolam at current strength.  Insomnia secondary to anxiety Changing Ambien from Ambien CR to Ambien IR as this seemed to work better for him.  Seborrheic dermatitis of scalp Adding topical fluocinonide as he did not have response to typical antifungal symptoms.  Sciatica Right-sided sciatica.  Adding course of prednisone and given handout for home exercise plan.  He will let me know if not improving with  this.   Meds ordered this encounter  Medications   fluocinonide (LIDEX) 0.05 % external solution    Sig: Apply 1 Application topically 2 (two) times daily.    Dispense:  60 mL    Refill:  0   citalopram (CELEXA) 20 MG tablet    Sig: Take 1 tablet (20 mg total) by mouth daily.    Dispense:  90 tablet    Refill:  1   zolpidem (AMBIEN) 10 MG tablet    Sig: Take 1 tablet (10 mg total) by mouth at bedtime as needed for sleep.    Dispense:  90 tablet    Refill:  0   predniSONE (DELTASONE) 50 MG tablet    Sig: Take 1 tab po daily    Dispense:  5 tablet    Refill:  0    Return in about 6 weeks (around 05/30/2022) for F/u Anxiety.    This visit occurred during the SARS-CoV-2 public health emergency.  Safety protocols were in place, including screening questions prior to the visit, additional usage of staff PPE, and extensive cleaning of exam room while observing appropriate contact time as indicated for disinfecting solutions.

## 2022-04-18 NOTE — Assessment & Plan Note (Signed)
Changing Ambien from Ambien CR to Ambien IR as this seemed to work better for him.

## 2022-04-30 ENCOUNTER — Other Ambulatory Visit: Payer: Self-pay | Admitting: Family Medicine

## 2022-04-30 DIAGNOSIS — F411 Generalized anxiety disorder: Secondary | ICD-10-CM

## 2022-05-03 ENCOUNTER — Other Ambulatory Visit: Payer: Self-pay | Admitting: Family Medicine

## 2022-05-03 DIAGNOSIS — F410 Panic disorder [episodic paroxysmal anxiety] without agoraphobia: Secondary | ICD-10-CM

## 2022-05-31 ENCOUNTER — Ambulatory Visit: Payer: No Typology Code available for payment source | Admitting: Family Medicine

## 2022-06-05 ENCOUNTER — Other Ambulatory Visit: Payer: Self-pay | Admitting: Family Medicine

## 2022-06-05 DIAGNOSIS — F410 Panic disorder [episodic paroxysmal anxiety] without agoraphobia: Secondary | ICD-10-CM

## 2022-06-06 ENCOUNTER — Encounter: Payer: Self-pay | Admitting: Family Medicine

## 2022-07-11 ENCOUNTER — Other Ambulatory Visit: Payer: Self-pay | Admitting: Family Medicine

## 2022-07-11 DIAGNOSIS — F410 Panic disorder [episodic paroxysmal anxiety] without agoraphobia: Secondary | ICD-10-CM

## 2022-07-12 NOTE — Telephone Encounter (Signed)
Please have him schedule f/u.  Rx sent in.

## 2022-08-14 ENCOUNTER — Other Ambulatory Visit: Payer: Self-pay | Admitting: Family Medicine

## 2022-08-14 DIAGNOSIS — F410 Panic disorder [episodic paroxysmal anxiety] without agoraphobia: Secondary | ICD-10-CM

## 2022-08-26 ENCOUNTER — Other Ambulatory Visit: Payer: Self-pay | Admitting: Family Medicine

## 2022-09-14 ENCOUNTER — Other Ambulatory Visit: Payer: Self-pay | Admitting: Family Medicine

## 2022-09-14 DIAGNOSIS — F410 Panic disorder [episodic paroxysmal anxiety] without agoraphobia: Secondary | ICD-10-CM

## 2022-09-19 ENCOUNTER — Encounter: Payer: Self-pay | Admitting: Family Medicine

## 2022-10-12 ENCOUNTER — Ambulatory Visit (INDEPENDENT_AMBULATORY_CARE_PROVIDER_SITE_OTHER): Payer: No Typology Code available for payment source | Admitting: Family Medicine

## 2022-10-12 ENCOUNTER — Encounter: Payer: Self-pay | Admitting: Family Medicine

## 2022-10-12 VITALS — BP 124/80 | HR 81 | Ht 67.0 in | Wt 136.0 lb

## 2022-10-12 DIAGNOSIS — F410 Panic disorder [episodic paroxysmal anxiety] without agoraphobia: Secondary | ICD-10-CM

## 2022-10-12 DIAGNOSIS — F419 Anxiety disorder, unspecified: Secondary | ICD-10-CM | POA: Diagnosis not present

## 2022-10-12 DIAGNOSIS — F411 Generalized anxiety disorder: Secondary | ICD-10-CM

## 2022-10-12 DIAGNOSIS — F5105 Insomnia due to other mental disorder: Secondary | ICD-10-CM

## 2022-10-12 MED ORDER — ZOLPIDEM TARTRATE 10 MG PO TABS: 10.0000 mg | ORAL_TABLET | Freq: Every evening | ORAL | 0 refills | Status: DC | PRN

## 2022-10-12 MED ORDER — CITALOPRAM HYDROBROMIDE 20 MG PO TABS: 20.0000 mg | ORAL_TABLET | Freq: Every day | ORAL | 1 refills | Status: DC

## 2022-10-12 MED ORDER — ALPRAZOLAM 0.5 MG PO TABS: ORAL_TABLET | ORAL | 2 refills | Status: DC

## 2022-10-12 NOTE — Progress Notes (Signed)
Vernon Sellers - 47 y.o. male MRN [id number]  Date of birth: 1975/12/09  Subjective Chief Complaint  Patient presents with   Anxiety    HPI Vernon Sellers is a 47 y.o. male here today for annual exam.   He reports that he is doing pretty well.  Continues to deal with anxiety.  Taking citalopram 20mg  daily with alprazolam as needed.  He is also using ambien as needed for sleep.  No side effects from medication at current strength.    ROS:  A comprehensive ROS was completed and negative except as noted per HPI  Allergies  Allergen Reactions   Celebrex [Celecoxib] Palpitations    Past Medical History:  Diagnosis Date   Anxiety    Asthma    History of chicken pox    LBP (low back pain)     Past Surgical History:  Procedure Laterality Date   TONSILLECTOMY      Social History   Socioeconomic History   Marital status: Single    Spouse name: Not on file   Number of children: 0   Years of education: Not on file   Highest education level: GED or equivalent  Occupational History   Occupation: [employer]     Comment: Consulting civil engineer  Tobacco Use   Smoking status: Every Day    Current packs/day: 1.00    Average packs/day: 1 pack/day for 32.8 years (32.8 ttl pk-yrs)    Types: Cigarettes    Start date: 01/02/1990    Passive exposure: Never   Smokeless tobacco: Never  Vaping Use   Vaping status: Former  Substance and Sexual Activity   Alcohol use: Yes    Alcohol/week: 3.0 standard drinks of alcohol    Types: 3 Standard drinks or equivalent per week   Drug use: No   Sexual activity: Not Currently    Partners: Female  Other Topics Concern   Not on file  Social History Narrative   Not on file   Social Determinants of Health   Financial Resource Strain: Low Risk  (04/16/2022)   Overall Financial Resource Strain (CARDIA)    Difficulty of Paying Living Expenses: Not very hard  Food Insecurity: No Food Insecurity (04/16/2022)   Hunger Vital Sign    Worried About  Running Out of Food in the Last Year: Never true    Ran Out of Food in the Last Year: Never true  Transportation Needs: No Transportation Needs (04/16/2022)   PRAPARE - Administrator, Civil Service (Medical): No    Lack of Transportation (Non-Medical): No  Physical Activity: Unknown (04/16/2022)   Exercise Vital Sign    Days of Exercise per Week: 0 days    Minutes of Exercise per Session: Not on file  Stress: Stress Concern Present (04/16/2022)   [employer] of Occupational Health - Occupational Stress Questionnaire    Feeling of Stress : Rather much  Social Connections: Moderately Isolated (04/16/2022)   Social Connection and Isolation Panel [NHANES]    Frequency of Communication with Friends and Family: Three times a week    Frequency of Social Gatherings with Friends and Family: Once a week    Attends Religious Services: 1 to 4 times per year    Active Member of [employer] or Organizations: No    Attends Engineer, structural: Not on file    Marital Status: Never married    Family History  Problem Relation Age of Onset   Kidney Stones Mother    Hepatitis C Father  Diabetes Father    Hypertension Father    Diabetes Maternal Grandmother    Breast cancer Paternal Grandmother        Metastatic   Heart disease Paternal Grandfather    Heart attack Paternal Grandfather    Heart failure Paternal Grandfather    Heart attack Maternal Uncle    Prostate cancer Maternal Uncle     Health Maintenance  Topic Date Due   COVID-19 Vaccine (1 - 2023-24 season) 10/28/2022 (Originally 09/03/2022)   INFLUENZA VACCINE  04/02/2023 (Originally 08/03/2022)   Hepatitis C Screening  10/12/2023 (Originally 02/08/1993)   HIV Screening  10/12/2023 (Originally 02/08/1990)   DTaP/Tdap/Td (2 - Td or Tdap) 03/03/2028   Colonoscopy  01/20/2032   HPV VACCINES  Aged Out      ----------------------------------------------------------------------------------------------------------------------------------------------------------------------------------------------------------------- Physical Exam BP 124/80 (BP Location: Right Arm, Patient Position: Sitting, Cuff Size: Small)   Pulse 81   Ht 5\' 7"  (1.702 m)   Wt 136 lb (61.7 kg)   SpO2 100%   BMI 21.30 kg/m   Physical Exam Constitutional:      Appearance: Normal appearance.  Eyes:     General: No scleral icterus. Cardiovascular:     Rate and Rhythm: Normal rate and regular rhythm.  Pulmonary:     Effort: Pulmonary effort is normal.     Breath sounds: Normal breath sounds.  Musculoskeletal:     Cervical back: Neck supple.  Neurological:     General: No focal deficit present.     Mental Status: He is alert.  Psychiatric:        Mood and Affect: Mood normal.        Behavior: Behavior normal.     ------------------------------------------------------------------------------------------------------------------------------------------------------------------------------------------------------------------- Assessment and Plan  Generalized anxiety disorder with panic attacks Stable with citalopram 20mg  daily..  Will continue alprazolam at current strength, try to limit as much as possible.   Insomnia secondary to anxiety Changing Ambien from Ambien CR to Ambien IR as this seemed to work better for him.   No orders of the defined types were placed in this encounter.   No follow-ups on file.    This visit occurred during the SARS-CoV-2 public health emergency.  Safety protocols were in place, including screening questions prior to the visit, additional usage of staff PPE, and extensive cleaning of exam room while observing appropriate contact time as indicated for disinfecting solutions.

## 2022-10-12 NOTE — Assessment & Plan Note (Signed)
Changing Ambien from Ambien CR to Ambien IR as this seemed to work better for him.

## 2022-10-12 NOTE — Assessment & Plan Note (Signed)
Stable with citalopram 20mg  daily..  Will continue alprazolam at current strength, try to limit as much as possible.

## 2022-12-22 ENCOUNTER — Other Ambulatory Visit: Payer: Self-pay | Admitting: Family Medicine

## 2023-01-29 ENCOUNTER — Other Ambulatory Visit: Payer: Self-pay | Admitting: Family Medicine

## 2023-01-29 DIAGNOSIS — F411 Generalized anxiety disorder: Secondary | ICD-10-CM

## 2023-03-06 ENCOUNTER — Encounter: Payer: Self-pay | Admitting: Family Medicine

## 2023-03-06 ENCOUNTER — Ambulatory Visit (INDEPENDENT_AMBULATORY_CARE_PROVIDER_SITE_OTHER): Payer: No Typology Code available for payment source | Admitting: Family Medicine

## 2023-03-06 VITALS — BP 118/78 | HR 80 | Ht 67.0 in | Wt 143.0 lb

## 2023-03-06 DIAGNOSIS — Z0000 Encounter for general adult medical examination without abnormal findings: Secondary | ICD-10-CM | POA: Diagnosis not present

## 2023-03-06 DIAGNOSIS — H6992 Unspecified Eustachian tube disorder, left ear: Secondary | ICD-10-CM | POA: Diagnosis not present

## 2023-03-06 DIAGNOSIS — Z13220 Encounter for screening for lipoid disorders: Secondary | ICD-10-CM | POA: Diagnosis not present

## 2023-03-06 NOTE — Assessment & Plan Note (Signed)
 Recommend trial of flonase.  Let me know if not improving after 3-4 weeks with this.

## 2023-03-06 NOTE — Patient Instructions (Signed)

## 2023-03-06 NOTE — Assessment & Plan Note (Signed)
 Well adult Orders Placed This Encounter  Procedures   CMP14+EGFR   CBC with Differential/Platelet   Lipid Panel With LDL/HDL Ratio  Screenings:  Per lab orders.  Immunizations:  Declines flu vaccine.  Anticipatory guidance/Risk factor reduction:  Recommendations per AVS.

## 2023-03-06 NOTE — Progress Notes (Signed)
 Pt has declined all vaccines. States he does not take vaccines.

## 2023-03-06 NOTE — Progress Notes (Signed)
 Vernon Sellers - 47 y.o. male MRN [id number]  Date of birth: February 03, 1975  Subjective Chief Complaint  Patient presents with   Annual Exam    HPI Vernon Sellers is a 48 y.o. male here today for annual exam.   He reports that he is doing pretty well. Notices popping and fluid sensation in L ear.   He is moderately active.  He was worked on making changes to his diet.   He is a daily smoker.  He consumes EtOH occasionally.   Review of Systems  Constitutional:  Negative for chills, fever, malaise/fatigue and weight loss.  HENT:  Negative for congestion, ear pain and sore throat.   Eyes:  Negative for blurred vision, double vision and pain.  Respiratory:  Negative for cough and shortness of breath.   Cardiovascular:  Negative for chest pain and palpitations.  Gastrointestinal:  Negative for abdominal pain, blood in stool, constipation, heartburn and nausea.  Genitourinary:  Negative for dysuria and urgency.  Musculoskeletal:  Negative for joint pain and myalgias.  Neurological:  Negative for dizziness and headaches.  Endo/Heme/Allergies:  Does not bruise/bleed easily.  Psychiatric/Behavioral:  Negative for depression. The patient is not nervous/anxious and does not have insomnia.     Allergies  Allergen Reactions   Celebrex [Celecoxib] Palpitations    Past Medical History:  Diagnosis Date   Anxiety    Asthma    History of chicken pox    LBP (low back pain)     Past Surgical History:  Procedure Laterality Date   TONSILLECTOMY      Social History   Socioeconomic History   Marital status: Single    Spouse name: Not on file   Number of children: 0   Years of education: Not on file   Highest education level: GED or equivalent  Occupational History   Occupation: [employer]     Comment: Consulting civil engineer  Tobacco Use   Smoking status: Every Day    Current packs/day: 1.00    Average packs/day: 1 pack/day for 33.2 years (33.2 ttl pk-yrs)    Types: Cigarettes    Start  date: 01/02/1990    Passive exposure: Never   Smokeless tobacco: Never  Vaping Use   Vaping status: Former  Substance and Sexual Activity   Alcohol use: Yes    Alcohol/week: 3.0 standard drinks of alcohol    Types: 3 Standard drinks or equivalent per week   Drug use: No   Sexual activity: Not Currently    Partners: Female  Other Topics Concern   Not on file  Social History Narrative   Not on file   Social Drivers of Health   Financial Resource Strain: High Risk (02/28/2023)   Overall Financial Resource Strain (CARDIA)    Difficulty of Paying Living Expenses: Hard  Food Insecurity: No Food Insecurity (02/28/2023)   Hunger Vital Sign    Worried About Running Out of Food in the Last Year: Never true    Ran Out of Food in the Last Year: Never true  Transportation Needs: No Transportation Needs (02/28/2023)   PRAPARE - Administrator, Civil Service (Medical): No    Lack of Transportation (Non-Medical): No  Physical Activity: Inactive (02/28/2023)   Exercise Vital Sign    Days of Exercise per Week: 5 days    Minutes of Exercise per Session: 0 min  Stress: Stress Concern Present (02/28/2023)   [employer] of Occupational Health - Occupational Stress Questionnaire    Feeling of Stress :  Very much  Social Connections: Socially Isolated (02/28/2023)   Social Connection and Isolation Panel [NHANES]    Frequency of Communication with Friends and Family: Once a week    Frequency of Social Gatherings with Friends and Family: Once a week    Attends Religious Services: Never    Database administrator or Organizations: No    Attends Engineer, structural: Not on file    Marital Status: Never married    Family History  Problem Relation Age of Onset   Kidney Stones Mother    Hepatitis C Father    Diabetes Father    Hypertension Father    Diabetes Maternal Grandmother    Breast cancer Paternal Grandmother        Metastatic   Heart disease Paternal Grandfather     Heart attack Paternal Grandfather    Heart failure Paternal Grandfather    Heart attack Maternal Uncle    Prostate cancer Maternal Uncle     Health Maintenance  Topic Date Due   INFLUENZA VACCINE  04/02/2023 (Originally 08/03/2022)   Hepatitis C Screening  10/12/2023 (Originally 02/08/1993)   HIV Screening  10/12/2023 (Originally 02/08/1990)   Pneumococcal Vaccine 22-10 Years old (1 of 2 - PCV) 03/05/2024 (Originally 02/08/1981)   COVID-19 Vaccine (1 - 2024-25 season) 03/21/2024 (Originally 09/03/2022)   DTaP/Tdap/Td (2 - Td or Tdap) 03/03/2028   Colonoscopy  01/20/2032   HPV VACCINES  Aged Out     ----------------------------------------------------------------------------------------------------------------------------------------------------------------------------------------------------------------- Physical Exam BP 118/78 (BP Location: Left Arm, Patient Position: Sitting, Cuff Size: Normal)   Pulse 80   Ht 5\' 7"  (1.702 m)   Wt 143 lb (64.9 kg)   SpO2 100%   BMI 22.40 kg/m   Physical Exam Constitutional:      General: He is not in acute distress. HENT:     Head: Normocephalic and atraumatic.     Right Ear: Tympanic membrane and external ear normal.     Left Ear: External ear normal.     Ears:     Comments: Air fluid level L TM Eyes:     General: No scleral icterus. Neck:     Thyroid: No thyromegaly.  Cardiovascular:     Rate and Rhythm: Normal rate and regular rhythm.     Heart sounds: Normal heart sounds.  Pulmonary:     Effort: Pulmonary effort is normal.     Breath sounds: Normal breath sounds.  Abdominal:     General: Bowel sounds are normal. There is no distension.     Palpations: Abdomen is soft.     Tenderness: There is no abdominal tenderness. There is no guarding.  Musculoskeletal:     Cervical back: Normal range of motion.  Lymphadenopathy:     Cervical: No cervical adenopathy.  Skin:    General: Skin is warm and dry.     Findings: No rash.   Neurological:     Mental Status: He is alert and oriented to person, place, and time.     Cranial Nerves: No cranial nerve deficit.     Motor: No abnormal muscle tone.  Psychiatric:        Mood and Affect: Mood normal.        Behavior: Behavior normal.     ------------------------------------------------------------------------------------------------------------------------------------------------------------------------------------------------------------------- Assessment and Plan  Well adult exam Well adult Orders Placed This Encounter  Procedures   CMP14+EGFR   CBC with Differential/Platelet   Lipid Panel With LDL/HDL Ratio  Screenings:  Per lab orders.  Immunizations:  Declines  flu vaccine.  Anticipatory guidance/Risk factor reduction:  Recommendations per AVS.   ETD (Eustachian tube dysfunction), left Recommend trial of flonase.  Let me know if not improving after 3-4 weeks with this.    No orders of the defined types were placed in this encounter.   No follow-ups on file.    This visit occurred during the SARS-CoV-2 public health emergency.  Safety protocols were in place, including screening questions prior to the visit, additional usage of staff PPE, and extensive cleaning of exam room while observing appropriate contact time as indicated for disinfecting solutions.

## 2023-03-07 LAB — CMP14+EGFR
ALT: 34 IU/L (ref 0–44)
AST: 25 IU/L (ref 0–40)
Albumin: 4.4 g/dL (ref 4.1–5.1)
Alkaline Phosphatase: 85 IU/L (ref 44–121)
BUN/Creatinine Ratio: 25 — ABNORMAL HIGH (ref 9–20)
BUN: 22 mg/dL (ref 6–24)
Bilirubin Total: 0.5 mg/dL (ref 0.0–1.2)
CO2: 23 mmol/L (ref 20–29)
Calcium: 9.3 mg/dL (ref 8.7–10.2)
Chloride: 103 mmol/L (ref 96–106)
Creatinine, Ser: 0.88 mg/dL (ref 0.76–1.27)
Globulin, Total: 2 g/dL (ref 1.5–4.5)
Glucose: 88 mg/dL (ref 70–99)
Potassium: 4.4 mmol/L (ref 3.5–5.2)
Sodium: 142 mmol/L (ref 134–144)
Total Protein: 6.4 g/dL (ref 6.0–8.5)
eGFR: 106 mL/min/{1.73_m2} (ref >59)

## 2023-03-07 LAB — CBC WITH DIFFERENTIAL/PLATELET
Basophils Absolute: 0.1 10*3/uL (ref 0.0–0.2)
Basos: 1 %
EOS (ABSOLUTE): 0.6 10*3/uL — ABNORMAL HIGH (ref 0.0–0.4)
Eos: 8 %
Hematocrit: 46 % (ref 37.5–51.0)
Hemoglobin: 16 g/dL (ref 13.0–17.7)
Immature Grans (Abs): 0 10*3/uL (ref 0.0–0.1)
Immature Granulocytes: 0 %
Lymphocytes Absolute: 1.9 10*3/uL (ref 0.7–3.1)
Lymphs: 23 %
MCH: 33.5 pg — ABNORMAL HIGH (ref 26.6–33.0)
MCHC: 34.8 g/dL (ref 31.5–35.7)
MCV: 96 fL (ref 79–97)
Monocytes Absolute: 0.9 10*3/uL (ref 0.1–0.9)
Monocytes: 10 %
Neutrophils Absolute: 4.8 10*3/uL (ref 1.4–7.0)
Neutrophils: 58 %
Platelets: 318 10*3/uL (ref 150–450)
RBC: 4.77 x10E6/uL (ref 4.14–5.80)
RDW: 12 % (ref 11.6–15.4)
WBC: 8.2 10*3/uL (ref 3.4–10.8)

## 2023-03-07 LAB — LIPID PANEL WITH LDL/HDL RATIO
Cholesterol, Total: 184 mg/dL (ref 100–199)
HDL: 61 mg/dL (ref >39)
LDL Chol Calc (NIH): 107 mg/dL — ABNORMAL HIGH (ref 0–99)
LDL/HDL Ratio: 1.8 ratio (ref 0.0–3.6)
Triglycerides: 85 mg/dL (ref 0–149)
VLDL Cholesterol Cal: 16 mg/dL (ref 5–40)

## 2023-03-11 ENCOUNTER — Encounter: Payer: Self-pay | Admitting: Family Medicine

## 2023-04-16 ENCOUNTER — Ambulatory Visit: Payer: No Typology Code available for payment source | Admitting: Family Medicine

## 2023-04-18 ENCOUNTER — Other Ambulatory Visit: Payer: Self-pay | Admitting: Family Medicine

## 2023-05-02 ENCOUNTER — Encounter: Payer: Self-pay | Admitting: Family Medicine

## 2023-05-02 ENCOUNTER — Ambulatory Visit (INDEPENDENT_AMBULATORY_CARE_PROVIDER_SITE_OTHER): Admitting: Family Medicine

## 2023-05-02 VITALS — BP 138/81 | HR 82 | Ht 67.0 in | Wt 147.0 lb

## 2023-05-02 DIAGNOSIS — H9312 Tinnitus, left ear: Secondary | ICD-10-CM | POA: Diagnosis not present

## 2023-05-02 DIAGNOSIS — F410 Panic disorder [episodic paroxysmal anxiety] without agoraphobia: Secondary | ICD-10-CM | POA: Diagnosis not present

## 2023-05-02 DIAGNOSIS — F411 Generalized anxiety disorder: Secondary | ICD-10-CM

## 2023-05-02 MED ORDER — CITALOPRAM HYDROBROMIDE 20 MG PO TABS: 30.0000 mg | ORAL_TABLET | Freq: Every day | ORAL | 1 refills | Status: DC

## 2023-05-02 MED ORDER — ALPRAZOLAM 0.5 MG PO TABS: ORAL_TABLET | ORAL | 2 refills | Status: DC

## 2023-05-02 NOTE — Assessment & Plan Note (Signed)
 Notes worsening tinnitus.  Has tried flonase and antihistamines.  Referral to ENT.

## 2023-05-02 NOTE — Progress Notes (Signed)
 Vernon Sellers - 48 y.o. male MRN [id number]  Date of birth: September 28, 1975  Subjective Chief Complaint  Patient presents with   Anxiety   Tendonitis    HPI Vernon Sellers is a 48 y.o. male here today for follow up of anxiety.  He reports an increase in his anxiety recently.  He is taking citalopram  20mg  daily.  Using alprazolam  daily as needed for severe anxiety.  Has been using this a little more recently.  Anxiety stems from work stress as well as stress related to his current living situation.  Feels that the neighborhood he is living in isn't very good.  His employer does offer EAP that he is looking in to.   Notes worsening tinnitus.  Has tried flonase and antihistamines for ETD.    ROS:  A comprehensive ROS was completed and negative except as noted per HPI  Allergies  Allergen Reactions   Celebrex  [Celecoxib ] Palpitations    Past Medical History:  Diagnosis Date   Anxiety    Asthma    History of chicken pox    LBP (low back pain)     Past Surgical History:  Procedure Laterality Date   TONSILLECTOMY      Social History   Socioeconomic History   Marital status: Single    Spouse name: Not on file   Number of children: 0   Years of education: Not on file   Highest education level: GED or equivalent  Occupational History   Occupation: [employer]     Comment: Consulting civil engineer  Tobacco Use   Smoking status: Every Day    Current packs/day: 1.00    Average packs/day: 1 pack/day for 33.3 years (33.3 ttl pk-yrs)    Types: Cigarettes    Start date: 01/02/1990    Passive exposure: Never   Smokeless tobacco: Never  Vaping Use   Vaping status: Former  Substance and Sexual Activity   Alcohol use: Yes    Alcohol/week: 3.0 standard drinks of alcohol    Types: 3 Standard drinks or equivalent per week   Drug use: No   Sexual activity: Not Currently    Partners: Female  Other Topics Concern   Not on file  Social History Narrative   Not on file   Social Drivers of  Health   Financial Resource Strain: High Risk (02/28/2023)   Overall Financial Resource Strain (CARDIA)    Difficulty of Paying Living Expenses: Hard  Food Insecurity: No Food Insecurity (02/28/2023)   Hunger Vital Sign    Worried About Running Out of Food in the Last Year: Never true    Ran Out of Food in the Last Year: Never true  Transportation Needs: No Transportation Needs (02/28/2023)   PRAPARE - Administrator, Civil Service (Medical): No    Lack of Transportation (Non-Medical): No  Physical Activity: Inactive (02/28/2023)   Exercise Vital Sign    Days of Exercise per Week: 5 days    Minutes of Exercise per Session: 0 min  Stress: Stress Concern Present (02/28/2023)   [employer] of Occupational Health - Occupational Stress Questionnaire    Feeling of Stress : Very much  Social Connections: Socially Isolated (02/28/2023)   Social Connection and Isolation Panel [NHANES]    Frequency of Communication with Friends and Family: Once a week    Frequency of Social Gatherings with Friends and Family: Once a week    Attends Religious Services: Never    Database administrator or Organizations: No  Attends Banker Meetings: Not on file    Marital Status: Never married    Family History  Problem Relation Age of Onset   Kidney Stones Mother    Hepatitis C Father    Diabetes Father    Hypertension Father    Diabetes Maternal Grandmother    Breast cancer Paternal Grandmother        Metastatic   Heart disease Paternal Grandfather    Heart attack Paternal Grandfather    Heart failure Paternal Grandfather    Heart attack Maternal Uncle    Prostate cancer Maternal Uncle     Health Maintenance  Topic Date Due   Hepatitis C Screening  10/12/2023 (Originally 02/08/1993)   HIV Screening  10/12/2023 (Originally 02/08/1990)   Pneumococcal Vaccine 41-39 Years old (1 of 2 - PCV) 03/05/2024 (Originally 02/08/1994)   COVID-19 Vaccine (1 - 2024-25 season) 03/21/2024  (Originally 09/03/2022)   INFLUENZA VACCINE  08/03/2023   DTaP/Tdap/Td (2 - Td or Tdap) 03/03/2028   Colonoscopy  01/20/2032   HPV VACCINES  Aged Out   Meningococcal B Vaccine  Aged Out     ----------------------------------------------------------------------------------------------------------------------------------------------------------------------------------------------------------------- Physical Exam BP 138/81 (BP Location: Left Arm, Patient Position: Sitting, Cuff Size: Normal)   Pulse 82   Ht 5\' 7"  (1.702 m)   Wt 147 lb (66.7 kg)   SpO2 99%   BMI 23.02 kg/m   Physical Exam Constitutional:      Appearance: Normal appearance.  Eyes:     General: No scleral icterus. Cardiovascular:     Rate and Rhythm: Normal rate and regular rhythm.  Pulmonary:     Effort: Pulmonary effort is normal.     Breath sounds: Normal breath sounds.  Musculoskeletal:     Cervical back: Neck supple.  Neurological:     Mental Status: He is alert.  Psychiatric:        Mood and Affect: Mood normal.        Behavior: Behavior normal.     ------------------------------------------------------------------------------------------------------------------------------------------------------------------------------------------------------------------- Assessment and Plan  Generalized anxiety disorder with panic attacks Increasing citalopram  to 30mg  daily.  Continue alprazolam  as needed.  Encouraged to look at counseling through EAP program.  F/u in 6-8 weeks.    Meds ordered this encounter  Medications   ALPRAZolam  (XANAX ) 0.5 MG tablet    Sig: TAKE 1/2 TO 1 TABLET BY MOUTH TWICE A DAY AS NEEDED FOR ANXIETY. *60 TABS MUST LAST 1 MONTH*    Dispense:  60 tablet    Refill:  2    Not to exceed 3 additional fills before 04/10/2023   citalopram  (CELEXA ) 20 MG tablet    Sig: Take 1.5 tablets (30 mg total) by mouth daily.    Dispense:  135 tablet    Refill:  1    No follow-ups on file.

## 2023-05-02 NOTE — Assessment & Plan Note (Signed)
 Increasing citalopram  to 30mg  daily.  Continue alprazolam  as needed.  Encouraged to look at counseling through EAP program.  F/u in 6-8 weeks.

## 2023-05-03 ENCOUNTER — Other Ambulatory Visit: Payer: Self-pay | Admitting: Family Medicine

## 2023-06-27 ENCOUNTER — Ambulatory Visit: Admitting: Family Medicine

## 2023-07-14 ENCOUNTER — Other Ambulatory Visit: Payer: Self-pay | Admitting: Family Medicine

## 2023-08-31 ENCOUNTER — Other Ambulatory Visit: Payer: Self-pay | Admitting: Family Medicine

## 2023-08-31 DIAGNOSIS — F410 Panic disorder [episodic paroxysmal anxiety] without agoraphobia: Secondary | ICD-10-CM

## 2023-11-16 ENCOUNTER — Other Ambulatory Visit: Payer: Self-pay | Admitting: Family Medicine

## 2023-11-19 ENCOUNTER — Encounter: Payer: Self-pay | Admitting: Family Medicine

## 2023-11-19 ENCOUNTER — Ambulatory Visit (INDEPENDENT_AMBULATORY_CARE_PROVIDER_SITE_OTHER): Admitting: Family Medicine

## 2023-11-19 VITALS — BP 150/81 | HR 111 | Ht 67.0 in | Wt 149.0 lb

## 2023-11-19 DIAGNOSIS — F5105 Insomnia due to other mental disorder: Secondary | ICD-10-CM | POA: Diagnosis not present

## 2023-11-19 DIAGNOSIS — F410 Panic disorder [episodic paroxysmal anxiety] without agoraphobia: Secondary | ICD-10-CM

## 2023-11-19 DIAGNOSIS — F411 Generalized anxiety disorder: Secondary | ICD-10-CM

## 2023-11-19 DIAGNOSIS — F419 Anxiety disorder, unspecified: Secondary | ICD-10-CM | POA: Diagnosis not present

## 2023-11-19 MED ORDER — ALPRAZOLAM 0.5 MG PO TABS: ORAL_TABLET | ORAL | 2 refills | Status: AC

## 2023-11-19 MED ORDER — ZOLPIDEM TARTRATE 10 MG PO TABS: 10.0000 mg | ORAL_TABLET | Freq: Every evening | ORAL | 0 refills | Status: AC | PRN

## 2023-11-19 MED ORDER — CITALOPRAM HYDROBROMIDE 20 MG PO TABS: 20.0000 mg | ORAL_TABLET | Freq: Every day | ORAL | 1 refills | Status: AC

## 2023-11-19 NOTE — Assessment & Plan Note (Addendum)
 Did not tolerate citalopram  at 30mg .  Continue at 20mg .  Continue alprazolam  as needed.  Encouraged to look at counseling through EAP program.  F/u in 6 months.

## 2023-11-19 NOTE — Progress Notes (Signed)
 Vernon Sellers - 48 y.o. male MRN [id number]  Date of birth: 1975/03/24  Subjective Chief Complaint  Patient presents with   Mood    HPI Vernon Sellers is a 48 y.o. male here today for follow up.   He reports that he is doing pretty well.   Remains on celexa  daily with alprazolam  as needed.  Continues to have stress related to work.  He is sleeping ok with Ambien  as needed.  No side effect at current strength.    ROS:  A comprehensive ROS was completed and negative except as noted per HPI   Allergies  Allergen Reactions   Celebrex  [Celecoxib ] Palpitations    Past Medical History:  Diagnosis Date   Anxiety    Asthma    History of chicken pox    LBP (low back pain)     Past Surgical History:  Procedure Laterality Date   TONSILLECTOMY      Social History   Socioeconomic History   Marital status: Single    Spouse name: Not on file   Number of children: 0   Years of education: Not on file   Highest education level: GED or equivalent  Occupational History   Occupation: [employer]     Comment: Consulting civil engineer  Tobacco Use   Smoking status: Every Day    Current packs/day: 1.00    Average packs/day: 1 pack/day for 33.9 years (33.9 ttl pk-yrs)    Types: Cigarettes    Start date: 01/02/1990    Passive exposure: Never   Smokeless tobacco: Never  Vaping Use   Vaping status: Former  Substance and Sexual Activity   Alcohol use: Yes    Alcohol/week: 3.0 standard drinks of alcohol    Types: 3 Standard drinks or equivalent per week   Drug use: No   Sexual activity: Not Currently    Partners: Female  Other Topics Concern   Not on file  Social History Narrative   Not on file   Social Drivers of Health   Financial Resource Strain: High Risk (11/17/2023)   Overall Financial Resource Strain (CARDIA)    Difficulty of Paying Living Expenses: Hard  Food Insecurity: No Food Insecurity (11/17/2023)   Hunger Vital Sign    Worried About Running Out of Food in the Last  Year: Never true    Ran Out of Food in the Last Year: Never true  Transportation Needs: No Transportation Needs (11/17/2023)   PRAPARE - Administrator, Civil Service (Medical): No    Lack of Transportation (Non-Medical): No  Physical Activity: Inactive (11/17/2023)   Exercise Vital Sign    Days of Exercise per Week: 0 days    Minutes of Exercise per Session: Not on file  Stress: Stress Concern Present (11/17/2023)   [employer] of Occupational Health - Occupational Stress Questionnaire    Feeling of Stress: Very much  Social Connections: Socially Isolated (11/17/2023)   Social Connection and Isolation Panel    Frequency of Communication with Friends and Family: Three times a week    Frequency of Social Gatherings with Friends and Family: Once a week    Attends Religious Services: Never    Database Administrator or Organizations: No    Attends Engineer, Structural: Not on file    Marital Status: Never married    Family History  Problem Relation Age of Onset   Kidney Stones Mother    Hepatitis C Father    Diabetes Father    Hypertension  Father    Diabetes Maternal Grandmother    Breast cancer Paternal Grandmother        Metastatic   Heart disease Paternal Grandfather    Heart attack Paternal Grandfather    Heart failure Paternal Grandfather    Heart attack Maternal Uncle    Prostate cancer Maternal Uncle     Health Maintenance  Topic Date Due   HIV Screening  Never done   Hepatitis C Screening  Never done   Pneumococcal Vaccine (1 of 2 - PCV) 03/05/2024 (Originally 02/08/1994)   Influenza Vaccine  04/01/2024 (Originally 08/03/2023)   Hepatitis B Vaccines 19-59 Average Risk (1 of 3 - 19+ 3-dose series) 11/18/2024 (Originally 02/08/1994)   COVID-19 Vaccine (1 - 2025-26 season) 12/04/2024 (Originally 09/03/2023)   DTaP/Tdap/Td (2 - Td or Tdap) 03/03/2028   Colonoscopy  01/20/2032   HPV VACCINES  Aged Out   Meningococcal B Vaccine  Aged Out      ----------------------------------------------------------------------------------------------------------------------------------------------------------------------------------------------------------------- Physical Exam BP (!) 131/91 (BP Location: Left Arm, Patient Position: Sitting, Cuff Size: Normal)   Pulse (!) 111   Ht 5' 7 (1.702 m)   Wt 149 lb (67.6 kg)   SpO2 100%   BMI 23.34 kg/m   Physical Exam Constitutional:      Appearance: Normal appearance.  HENT:     Head: Normocephalic and atraumatic.  Eyes:     General: No scleral icterus. Cardiovascular:     Rate and Rhythm: Normal rate and regular rhythm.  Pulmonary:     Effort: Pulmonary effort is normal.     Breath sounds: Normal breath sounds.  Musculoskeletal:     Cervical back: Neck supple.  Neurological:     Mental Status: He is alert.  Psychiatric:        Mood and Affect: Mood normal.        Behavior: Behavior normal.     ------------------------------------------------------------------------------------------------------------------------------------------------------------------------------------------------------------------- Assessment and Plan  Generalized anxiety disorder with panic attacks Did not tolerate citalopram  at 30mg .  Continue at 20mg .  Continue alprazolam  as needed.  Encouraged to look at counseling through [facility] program.  F/u in 6 months.   Insomnia secondary to anxiety Continue ambien  as needed for insomnia.    Meds ordered this encounter  Medications   ALPRAZolam  (XANAX ) 0.5 MG tablet    Sig: TAKE 1/2 TO 1 TABLET BY MOUTH TWICE A DAY AS NEEDED FOR ANXIETY. *60 TABS MUST LAST 1 MONTH*    Dispense:  60 tablet    Refill:  2    Not to exceed 3 additional fills before 10/29/2023   citalopram  (CELEXA ) 20 MG tablet    Sig: Take 1 tablet (20 mg total) by mouth daily.    Dispense:  90 tablet    Refill:  1   zolpidem  (AMBIEN ) 10 MG tablet    Sig: Take 1 tablet (10 mg total) by mouth  at bedtime as needed for sleep.    Dispense:  90 tablet    Refill:  0    Not to exceed 5 additional fills before 10/31/2023    Return in about 6 months (around 05/18/2024) for Annual Exam.

## 2023-11-19 NOTE — Assessment & Plan Note (Signed)
 Continue ambien  as needed for insomnia.

## 2023-11-20 ENCOUNTER — Ambulatory Visit: Admitting: Family Medicine

## 2024-05-19 ENCOUNTER — Encounter: Admitting: Family Medicine
